# Patient Record
Sex: Female | Born: 1945 | Race: White | Hispanic: No | Smoking: Former smoker
Health system: Southern US, Community
[De-identification: ages and names within clinical notes are randomized; demographics above are authoritative.]

## PROBLEM LIST (undated history)

## (undated) DIAGNOSIS — D126 Benign neoplasm of colon, unspecified: Secondary | ICD-10-CM

## (undated) DIAGNOSIS — E039 Hypothyroidism, unspecified: Secondary | ICD-10-CM

## (undated) DIAGNOSIS — F419 Anxiety disorder, unspecified: Secondary | ICD-10-CM

## (undated) DIAGNOSIS — I491 Atrial premature depolarization: Secondary | ICD-10-CM

## (undated) HISTORY — DX: Atrial premature depolarization: I49.1

## (undated) HISTORY — DX: Hypothyroidism, unspecified: E03.9

## (undated) HISTORY — DX: Benign neoplasm of colon, unspecified: D12.6

## (undated) HISTORY — PX: CARPAL TUNNEL RELEASE: SHX101

## (undated) HISTORY — PX: BREAST BIOPSY: SHX20

## (undated) HISTORY — DX: Anxiety disorder, unspecified: F41.9

---
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
## 1972-09-15 HISTORY — PX: EXCISIONAL HEMORRHOIDECTOMY: SHX1541

## 1984-09-15 HISTORY — PX: THYROIDECTOMY: SHX17

## 1998-01-24 ENCOUNTER — Other Ambulatory Visit: Admission: RE | Admit: 1998-01-24 | Discharge: 1998-01-24 | Payer: Self-pay | Admitting: Obstetrics & Gynecology

## 1998-04-30 ENCOUNTER — Other Ambulatory Visit: Admission: RE | Admit: 1998-04-30 | Discharge: 1998-04-30 | Payer: Self-pay | Admitting: Obstetrics & Gynecology

## 1998-10-16 ENCOUNTER — Other Ambulatory Visit: Admission: RE | Admit: 1998-10-16 | Discharge: 1998-10-16 | Payer: Self-pay | Admitting: Obstetrics & Gynecology

## 1999-05-22 ENCOUNTER — Other Ambulatory Visit: Admission: RE | Admit: 1999-05-22 | Discharge: 1999-05-22 | Payer: Self-pay | Admitting: Obstetrics & Gynecology

## 2000-06-04 ENCOUNTER — Other Ambulatory Visit: Admission: RE | Admit: 2000-06-04 | Discharge: 2000-06-04 | Payer: Self-pay | Admitting: Obstetrics & Gynecology

## 2001-09-01 ENCOUNTER — Other Ambulatory Visit: Admission: RE | Admit: 2001-09-01 | Discharge: 2001-09-01 | Payer: Self-pay | Admitting: Obstetrics & Gynecology

## 2002-11-14 ENCOUNTER — Other Ambulatory Visit: Admission: RE | Admit: 2002-11-14 | Discharge: 2002-11-14 | Payer: Self-pay | Admitting: Obstetrics & Gynecology

## 2004-04-10 ENCOUNTER — Other Ambulatory Visit: Admission: RE | Admit: 2004-04-10 | Discharge: 2004-04-10 | Payer: Self-pay | Admitting: Obstetrics & Gynecology

## 2004-09-18 ENCOUNTER — Emergency Department: Payer: Self-pay | Admitting: General Practice

## 2005-03-14 ENCOUNTER — Emergency Department: Payer: Self-pay | Admitting: Emergency Medicine

## 2005-03-22 ENCOUNTER — Other Ambulatory Visit: Payer: Self-pay

## 2005-03-22 ENCOUNTER — Emergency Department: Payer: Self-pay | Admitting: Emergency Medicine

## 2005-05-29 ENCOUNTER — Other Ambulatory Visit: Admission: RE | Admit: 2005-05-29 | Discharge: 2005-05-29 | Payer: Self-pay | Admitting: Obstetrics and Gynecology

## 2006-08-27 ENCOUNTER — Ambulatory Visit: Payer: Self-pay | Admitting: Gastroenterology

## 2006-09-11 ENCOUNTER — Ambulatory Visit: Payer: Self-pay | Admitting: Gastroenterology

## 2006-09-11 ENCOUNTER — Encounter (INDEPENDENT_AMBULATORY_CARE_PROVIDER_SITE_OTHER): Payer: Self-pay | Admitting: *Deleted

## 2006-09-11 DIAGNOSIS — K648 Other hemorrhoids: Secondary | ICD-10-CM | POA: Insufficient documentation

## 2006-09-11 DIAGNOSIS — D126 Benign neoplasm of colon, unspecified: Secondary | ICD-10-CM

## 2006-12-23 ENCOUNTER — Ambulatory Visit: Payer: Self-pay | Admitting: Gastroenterology

## 2006-12-23 LAB — CONVERTED CEMR LAB
Ceruloplasmin: 50 mg/dL (ref 21–63)
Ferritin: 32.4 ng/mL (ref 10.0–291.0)
HCV Ab: NEGATIVE
Hep B S Ab: NEGATIVE
INR: 1 (ref 0.9–2.0)
Prothrombin Time: 12.1 s (ref 10.0–14.0)
Saturation Ratios: 17.7 % — ABNORMAL LOW (ref 20.0–50.0)

## 2006-12-25 ENCOUNTER — Ambulatory Visit: Payer: Self-pay | Admitting: Cardiovascular Disease

## 2006-12-25 DIAGNOSIS — D376 Neoplasm of uncertain behavior of liver, gallbladder and bile ducts: Secondary | ICD-10-CM | POA: Insufficient documentation

## 2007-09-02 ENCOUNTER — Ambulatory Visit: Payer: Self-pay | Admitting: Cardiology

## 2007-09-08 ENCOUNTER — Ambulatory Visit: Payer: Self-pay | Admitting: Gastroenterology

## 2007-09-15 DIAGNOSIS — T7840XA Allergy, unspecified, initial encounter: Secondary | ICD-10-CM | POA: Insufficient documentation

## 2007-09-15 DIAGNOSIS — K449 Diaphragmatic hernia without obstruction or gangrene: Secondary | ICD-10-CM | POA: Insufficient documentation

## 2007-09-15 DIAGNOSIS — R945 Abnormal results of liver function studies: Secondary | ICD-10-CM

## 2007-09-15 DIAGNOSIS — E7800 Pure hypercholesterolemia, unspecified: Secondary | ICD-10-CM

## 2007-09-21 ENCOUNTER — Ambulatory Visit: Payer: Self-pay | Admitting: Gastroenterology

## 2007-09-22 ENCOUNTER — Encounter (INDEPENDENT_AMBULATORY_CARE_PROVIDER_SITE_OTHER): Payer: Self-pay | Admitting: Interventional Radiology

## 2007-09-22 ENCOUNTER — Ambulatory Visit (HOSPITAL_COMMUNITY): Admission: RE | Admit: 2007-09-22 | Discharge: 2007-09-22 | Payer: Self-pay | Admitting: Gastroenterology

## 2007-10-26 ENCOUNTER — Emergency Department: Payer: Self-pay | Admitting: Emergency Medicine

## 2007-10-27 ENCOUNTER — Ambulatory Visit: Payer: Self-pay | Admitting: Gastroenterology

## 2010-01-03 ENCOUNTER — Encounter: Admission: RE | Admit: 2010-01-03 | Discharge: 2010-01-03 | Payer: Self-pay | Admitting: Endocrinology

## 2010-04-15 ENCOUNTER — Ambulatory Visit: Payer: Self-pay | Admitting: Surgery

## 2010-04-17 ENCOUNTER — Telehealth (INDEPENDENT_AMBULATORY_CARE_PROVIDER_SITE_OTHER): Payer: Self-pay | Admitting: *Deleted

## 2010-04-18 ENCOUNTER — Encounter (HOSPITAL_COMMUNITY): Admission: RE | Admit: 2010-04-18 | Discharge: 2010-06-07 | Payer: Self-pay | Admitting: Surgery

## 2010-04-18 ENCOUNTER — Ambulatory Visit: Payer: Self-pay | Admitting: Internal Medicine

## 2010-04-18 ENCOUNTER — Encounter: Payer: Self-pay | Admitting: Internal Medicine

## 2010-04-18 ENCOUNTER — Ambulatory Visit: Payer: Self-pay

## 2010-04-22 ENCOUNTER — Ambulatory Visit: Payer: Self-pay | Admitting: Surgery

## 2010-04-22 ENCOUNTER — Encounter: Admission: RE | Admit: 2010-04-22 | Discharge: 2010-04-22 | Payer: Self-pay | Admitting: Surgery

## 2010-04-25 ENCOUNTER — Encounter: Payer: Self-pay | Admitting: Surgery

## 2010-04-26 ENCOUNTER — Ambulatory Visit: Payer: Self-pay | Admitting: Surgery

## 2010-04-26 ENCOUNTER — Inpatient Hospital Stay (HOSPITAL_COMMUNITY): Admission: RE | Admit: 2010-04-26 | Discharge: 2010-04-27 | Payer: Self-pay | Admitting: Surgery

## 2010-05-13 ENCOUNTER — Ambulatory Visit: Payer: Self-pay | Admitting: Surgery

## 2010-09-15 HISTORY — PX: CAROTID ENDARTERECTOMY: SUR193

## 2010-10-06 ENCOUNTER — Encounter: Payer: Self-pay | Admitting: Gastroenterology

## 2010-10-17 NOTE — Progress Notes (Signed)
Summary: Nuclear Pre-Pocedure  Phone Note Outgoing Call   Call placed by: Milana Na, EMT-P,  April 17, 2010 8:50 AM Summary of Call: Left message with information on Myoview Information Sheet (see scanned document for details). No Answer.     Nuclear Med Background Indications for Stress Test: Evaluation for Ischemia, Surgical Clearance  Indications Comments: Pending Carotid Surgery       Nuclear Pre-Procedure Cardiac Risk Factors: Carotid Disease, Family History - CAD, Hypertension, NIDDM, Smoker  Nuclear Med Study Referring MD:  Melissa Noon

## 2010-10-17 NOTE — Assessment & Plan Note (Signed)
Summary: Cardiology Nuclear Testing  Nuclear Med Background Indications for Stress Test: Evaluation for Ischemia, Surgical Clearance  Indications Comments: Pending Carotid Surgery  History: Myocardial Infarction, Myocardial Perfusion Study  History Comments: MPS EF 79% (-) ischemia MVP     Nuclear Pre-Procedure Cardiac Risk Factors: Carotid Disease, Family History - CAD, Hypertension, NIDDM, Smoker Caffeine/Decaff Intake: None NPO After: 12:00 AM Lungs: clear IV 0.9% NS with Angio Cath: 22g     IV Site: (R) AC IV Started by: Irean Hong RN Chest Size (in) 38     Cup Size C     Height (in): 62 Weight (lb): 143 BMI: 26.25  Nuclear Med Study 1 or 2 day study:  1 day     Stress Test Type:  Eugenie Birks Reading MD:  Dietrich Pates, MD     Referring MD:  Coral Else Resting Radionuclide:  Technetium 4m Tetrofosmin     Resting Radionuclide Dose:  11.0 mCi  Stress Radionuclide:  Technetium 53m Tetrofosmin     Stress Radionuclide Dose:  30.1 mCi   Stress Protocol   Lexiscan: 0.4 mg   Stress Test Technologist:  Milana Na EMT-P     Nuclear Technologist:  Domenic Polite CNMT  Rest Procedure  Myocardial perfusion imaging was performed at rest 45 minutes following the intravenous administration of Myoview Technetium 52m Tetrofosmin.  Stress Procedure  The patient received IV Lexiscan 0.4 mg over 15-seconds.  Myoview injected at 30-seconds.  There were non specific changes and rare pacs with infusion.  Quantitative spect images were obtained after a 45 minute delay.  QPS Raw Data Images:  Extensive soft tissue (breast, diaphragm) surround heart. Stress Images:  There is normal uptake in all areas. Rest Images:  Normal homogeneous uptake in all areas of the myocardium. Subtraction (SDS):  No evidence of ischemia. Transient Ischemic Dilatation:  .81  (Normal <1.22)  Lung/Heart Ratio:  .31  (Normal <0.45)  Quantitative Gated Spect Images QGS EDV:  57 ml QGS ESV:  18 ml QGS  EF:  69 %   Overall Impression  Exercise Capacity: Lexiscan lprotocol. BP Response: Normal blood pressure response. Clinical Symptoms: No chest pain ECG Impression: No significant ST segment change suggestive of ischemia. Overall Impression: Normal stress nuclear study.

## 2010-11-04 ENCOUNTER — Ambulatory Visit: Payer: Self-pay | Admitting: Surgery

## 2010-11-04 ENCOUNTER — Other Ambulatory Visit: Payer: Self-pay

## 2010-11-29 LAB — CBC
HCT: 35.9 % — ABNORMAL LOW (ref 36.0–46.0)
HCT: 42.1 % (ref 36.0–46.0)
Hemoglobin: 11.6 g/dL — ABNORMAL LOW (ref 12.0–15.0)
Hemoglobin: 14.3 g/dL (ref 12.0–15.0)
MCH: 28.4 pg (ref 26.0–34.0)
MCH: 29.7 pg (ref 26.0–34.0)
MCHC: 34 g/dL (ref 30.0–36.0)
MCV: 87.5 fL (ref 78.0–100.0)
RBC: 4.09 MIL/uL (ref 3.87–5.11)
RBC: 4.81 MIL/uL (ref 3.87–5.11)

## 2010-11-29 LAB — TYPE AND SCREEN: Antibody Screen: NEGATIVE

## 2010-11-29 LAB — COMPREHENSIVE METABOLIC PANEL
ALT: 36 U/L — ABNORMAL HIGH (ref 0–35)
BUN: 5 mg/dL — ABNORMAL LOW (ref 6–23)
CO2: 25 mEq/L (ref 19–32)
Calcium: 9.1 mg/dL (ref 8.4–10.5)
Creatinine, Ser: 0.74 mg/dL (ref 0.4–1.2)
GFR calc non Af Amer: >60 mL/min (ref >60)
Glucose, Bld: 86 mg/dL (ref 70–99)
Sodium: 135 mEq/L (ref 135–145)
Total Protein: 7.1 g/dL (ref 6.0–8.3)

## 2010-11-29 LAB — BASIC METABOLIC PANEL
Chloride: 104 mEq/L (ref 96–112)
GFR calc non Af Amer: >60 mL/min (ref >60)
Glucose, Bld: 161 mg/dL — ABNORMAL HIGH (ref 70–99)
Potassium: 3.3 mEq/L — ABNORMAL LOW (ref 3.5–5.1)
Sodium: 138 mEq/L (ref 135–145)

## 2010-11-29 LAB — URINALYSIS, ROUTINE W REFLEX MICROSCOPIC
Glucose, UA: NEGATIVE mg/dL
Ketones, ur: NEGATIVE mg/dL
Protein, ur: NEGATIVE mg/dL
Urobilinogen, UA: 0.2 mg/dL (ref 0.0–1.0)

## 2010-11-29 LAB — APTT: aPTT: 28 seconds (ref 24–37)

## 2010-11-29 LAB — PROTIME-INR: Prothrombin Time: 13.5 seconds (ref 11.6–15.2)

## 2010-12-26 ENCOUNTER — Other Ambulatory Visit: Payer: Self-pay | Admitting: Surgery

## 2010-12-26 DIAGNOSIS — R911 Solitary pulmonary nodule: Secondary | ICD-10-CM

## 2011-01-20 ENCOUNTER — Ambulatory Visit
Admission: RE | Admit: 2011-01-20 | Discharge: 2011-01-20 | Disposition: A | Discharge status: Home / Self Care | Source: Ambulatory Visit | Attending: Surgery | Admitting: Surgery

## 2011-01-20 ENCOUNTER — Other Ambulatory Visit (INDEPENDENT_AMBULATORY_CARE_PROVIDER_SITE_OTHER)

## 2011-01-20 ENCOUNTER — Ambulatory Visit (INDEPENDENT_AMBULATORY_CARE_PROVIDER_SITE_OTHER): Admitting: Surgery

## 2011-01-20 DIAGNOSIS — I6529 Occlusion and stenosis of unspecified carotid artery: Secondary | ICD-10-CM

## 2011-01-20 DIAGNOSIS — R911 Solitary pulmonary nodule: Secondary | ICD-10-CM

## 2011-01-20 DIAGNOSIS — Z48812 Encounter for surgical aftercare following surgery on the circulatory system: Secondary | ICD-10-CM

## 2011-01-20 MED ORDER — IOHEXOL 300 MG/ML  SOLN
75.0000 mL | Freq: Once | INTRAMUSCULAR | Status: AC | PRN
  Administered 2011-01-20: 75 mL via INTRAVENOUS

## 2011-01-20 NOTE — Assessment & Plan Note (Signed)
OFFICE VISIT  Amores, [name] [name] DOB:  11/05/1945                                       01/20/2011 AVWUJ#:[id number]  REASON FOR VISIT:  Followup carotid endarterectomy, right.  HISTORY:  This is a 65 year old female who is status post right carotid endarterectomy for asymptomatic stenosis on 04/27/2011.  She had a very high lesion and did have a mild marginal mandibular neurapraxia postoperatively but this has completely resolved.  She comes back today for followup.  She has no neurologic findings.  No complaints at this time.  She did have a 2.6 mm lung nodule seen on CT when I got a CT scan of her neck preoperatively to evaluate the distal extent of her carotid lesion.  She also has a repeat CT scan today.  She has been doing very well in the interim since I last saw her.  PHYSICAL EXAM:  Vital signs:  Heart rate 85, blood pressure 124/72, O2 sats 98%.  General:  She is well-appearing, in no distress.  HEENT: Within normal limits.  Right carotid incision is well-healed.  Lungs: Clear bilaterally.  Cardiovascular:  Regular rate and rhythm. Neurological:  She is intact.  Cranial nerves II-XII intact.  DIAGNOSTIC STUDIES:  Patent right carotid endarterectomy site without evidence of stenosis.  The left internal stenosis is 1%-39%.  Chest CT scan:  Stable noncalcified 2 mm nodule in the anterior left upper lobe of doubtful significance.  ASSESSMENT AND PLAN: 1. Carotid stenosis:  The patient will be placed on our surveillance     protocol.  Her next scan will be in 1 year. 2. Lung nodule:  The 6 month followup CT scan shows this lesion to be     of doubtful significance, I do not believe it needs to be followed     anymore.    Jorge Ny, MD Electronically Signed  VWB/MEDQ  D:  01/20/2011  T:  01/20/2011  Job:  [id number]  cc:   Reather Littler, M.D.

## 2011-01-28 NOTE — Assessment & Plan Note (Signed)
[facility]                         GASTROENTEROLOGY OFFICE NOTE   NAME:[name] [name], [name] [name]                        MRN:          [id number]  DATE:09/08/2007                            DOB:          10/18/1945    Ms. Pinho returns for followup of abnormal liver function tests.  Blood  work from Dr. Remus Blake office dated August 30, 2007 shows an ALT of 81,  an AST of 58, and a normal alkaline phosphatase, bilirubin, and albumin.  A CT scan of the abdomen and pelvis from September 02, 2007 showed stable  small hepatic cysts and mild distal esophageal wall thickening  suggestive of possible esophagitis.  She denies any ongoing reflux  symptoms, dysphagia, odynophagia, or weight loss.   CURRENT MEDICATIONS:  Listed on the chart.  Updated and reviewed.   MEDICATION ALLERGIES:  1. PENICILLIN.  2. SULFA DRUGS.   PHYSICAL EXAMINATION:  GENERAL:  No acute distress.  VITAL SIGNS:  Weight 145.6 pounds, blood pressure is 152/90, pulse 68  and regular.   She was not reexamined.   ASSESSMENT AND PLAN:  1. Abnormal liver function tests.  Currently, her transaminases are      mildly elevated.  The elevations have included alkaline phosphatase      in the past.  No clear etiology.  There was a concern that the      liver test abnormalities were related to statin drugs, but they      appear to have fluctuated substantially over the past year off all      statins.  Proceed with an ultrasound-guided liver biopsy for      further evaluation.  2. Abnormal thickening of the distal esophagus on CT scan.  Rule out      esophagitis or less likely infiltrative lesions in the distal      esophagus.  Risks, benefits, and alternatives to upper endoscopy      with possible biopsy discussed with the patient, and she consents      to proceed.  This will be scheduled electively.  3. Personal history of adenomatous colon polyps.  Recall colonoscopy      recommended for December  2012.     [name]. Russella Dar, MD, [facility]  Electronically Signed    MTS/MedQ  DD: 09/08/2007  DT: 09/08/2007  Job #: [id number]   cc:   Reather Littler, M.D.

## 2011-01-28 NOTE — Assessment & Plan Note (Signed)
OFFICE VISIT   [name] [name], [name] [name]  DOB:  01/18/1946                                       05/13/2010  ZOXWR#:[id number]   The patient comes back in today.  She is status post right carotid  endarterectomy with patch angioplasty on April 26, 2010 for  asymptomatic disease.  She had a very high lesion.  She had developed a  marginal mandibular neurapraxia but has not had any complicating  sequelae from it.  Her incision is well-healed.  She has no neurologic  deficits.   The patient will come back to see me in 6 months with a repeat carotid  ultrasound.  There was also a 2.6 mm lung nodule seen on her CT of her  neck.  I will  get a CT of her chest to better evaluate this at her next  visit.     Jorge Ny, MD  Electronically Signed   VWB/MEDQ  D:  05/13/2010  T:  05/14/2010  Job:  [id number]   cc:   Reather Littler, M.D.

## 2011-01-28 NOTE — Procedures (Signed)
CAROTID DUPLEX EXAM   INDICATION:  Known carotid disease.   HISTORY:  Diabetes:  No.  Cardiac:  Palpitations/SVT.  Hypertension:  No.  Smoking:  Yes.  Previous Surgery:  No.  CV History:  Asymptomatic.  Amaurosis Fugax No, Paresthesias No, Hemiparesis No.                                       RIGHT             LEFT  Brachial systolic pressure:         124               126  Brachial Doppler waveforms:         Normal            Normal  Vertebral direction of flow:        Antegrade         Antegrade  DUPLEX VELOCITIES (cm/sec)  CCA peak systolic                   69                100  ECA peak systolic                   102               90  ICA peak systolic                   536/mid           158  ICA end diastolic                   246/mid           66  PLAQUE MORPHOLOGY:                  Mixed             Mixed  PLAQUE AMOUNT:                      Severe            Moderate  PLAQUE LOCATION:                    ICA               ICA   IMPRESSION:  1. Doppler velocities suggest a high-grade stenosis of 80% to 99% in      the right internal carotid artery.  2. Doppler velocities suggest 40% to 59% stenosis in the left internal      carotid artery.  3. Antegrade flow noted in the bilateral vertebral arteries.   ___________________________________________  V. Charlena Cross, MD   NT/MEDQ  D:  04/15/2010  T:  04/15/2010  Job:  [id number]

## 2011-01-28 NOTE — Assessment & Plan Note (Signed)
OFFICE VISIT   Gradilla, [name] [name]  DOB:  June 29, 1946                                       04/15/2010  AOZHY#:[id number]   REASON FOR VISIT:  Asymptomatic right carotid stenosis.   HISTORY:  This is a 65 year old female seen at request of Dr. Lucianne Muss for  evaluation of right carotid stenosis.  The carotid disease was picked up  on by auscultation of a bruit.  The patient does not have any symptoms  such as numbness or weakness in either extremity, slurred speech or  amaurosis fugax.   The patient has a history of hypertension which has been medically  managed.  She does take cholesterol-lowering agents.  She has a history  smoking but is down to 2 cigarettes per day.  The patient does have  occasionally supraventricular tachycardia, she says about 1-2 times per  year.  It usually is alleviated with Valsalva maneuver but has required  adenosine.  She is seen Dr. Graciela Husbands in the past for this.   REVIEW OF SYSTEMS:  GENERAL:  Negative fevers, chills, weight gain,  weight loss.  VASCULAR:  Negative.  CARDIAC:  Positive for palpitations and arrhythmia (SVT).  GI:  Negative.  NEURO:  Negative.  PULMONARY:  HEME:  Negative.  GU:  Negative.  EENT:  Negative.  MUSCULOSKELETAL:  Negative.  PSYCH:  Negative.  SKIN:  Negative.   PAST MEDICAL HISTORY:  Diet-controlled hypertension,  hypercholesterolemia, supraventricular tachycardia.   PAST SURGICAL HISTORY:  A tonsillectomy, hemorrhoid surgery, breast  biopsy x2, thyroidectomy, carpal tunnel release bilaterally.   SOCIAL HISTORY:  She is retired and widowed.  Currently smokes 2 packs a  day.  Does not drink alcohol.   FAMILY HISTORY:  Positive for heart attack in her father at age 3.   ALLERGIES:  To penicillin and sulfa.   PHYSICAL EXAMINATION:  Heart rate 72.  Blood pressure 126/83 on the  right, 148/81 the left.  O2 saturation is 99%.  General:  She is well-  appearing, in no distress.  HEENT:  Within  normal limits.  Lungs are  clear bilaterally.  No wheezes or rhonchi.  Cardiovascular:  Regular  rate and rhythm, right positive right carotid bruit, palpable pedal  pulses.  Abdomen:  Soft, nontender.  Musculoskeletal:  Without major  deformities or cyanosis.  Neuro:  Cranial II-XII are grossly intact.  She has no focal deficits.  Skin:  Without rash.   DIAGNOSTIC STUDIES:  I have ordered and independently reviewed her  carotid ultrasound, which reveals a greater than 80% right carotid  stenosis.  Velocities on the left suggest 40%-59% stenosis.  The right  carotid has a high stenosis in mid ICA.   ASSESSMENT/PLAN:  Asymptomatic right carotid stenosis.   PLAN:  I have discussed interventions of operation versus stent for this  situation.  We discussed the risk of stroke, the risk of nerve injury  and cardiopulmonary complications should we proceed with the operative  route.  I would prefer this in her; however, by ultrasound the lesion is  very high.  I think this needs to be further evaluated.  I plan on  getting a CT angiogram to identify the extent and location of her  stenosis.  I will have this done this coming Monday.  I will see her  following that and we will plan on  her operation if she is an operative  candidate on Friday, August 12.  I am sending her to get a Myoview and  cardiac clearance.  She is a former patient of Dr. Graciela Husbands.     Jorge Ny, MD  Electronically Signed   VWB/MEDQ  D:  04/15/2010  T:  04/16/2010  Job:  [id number]   cc:   Reather Littler, M.D.

## 2011-01-28 NOTE — Assessment & Plan Note (Signed)
[facility]                         GASTROENTEROLOGY OFFICE NOTE   NAME:[name] [name] RUDA                        MRN:          [id number]  DATE:10/27/2007                            DOB:          [birth year]    This is a return office visit for gastritis, GERD, duodenitis and  abnormal liver function tests.  Her liver biopsy showed benign liver  tissue.  LA classification grade B erosive esophagitis was noted on  endoscopy.  She did not take omeprazole as prescribed because she  relates that she had side effects from Prilosec in the past, including  anxiety and epigastric pain.   CURRENT MEDICATIONS:  Listed on the chart, updated and reviewed.   MEDICATION ALLERGIES:  PENICILLIN, SULFA DRUGS AND PRILOSEC.   PHYSICAL EXAMINATION:  No acute distress.  Weight 145 pounds.  Blood  pressure 128/76, pulse 92 and regular.  She is not reexamined.   ASSESSMENT/PLAN:  1. Abnormal liver function tests.  Her liver biopsy was entirely      normal.  No identifiable hepatic disorder.  I think it would be      reasonable to follow her liver function tests every six months.      This can be performed in Dr. Remus Blake office at her routine visits.      I would like to see her back if her liver function tests      substantially increase for consideration of reevaluation.  2. Gastroesophageal reflux disease with erosive esophagitis, gastritis      and duodenitis.  She is very reluctant to take an acid suppressant      long term.  I am not quite sure of the reason.  I have advised her      to begin pantoprazole 40 mg p.o. q.a.m. with standard antireflux      measures for the next two months.  I informed her that erosive      esophagitis is likely to recur and although she should be healed at      eight weeks, we generally recommend long term acid suppression      therapy.  She is only comfortable with two months of medication at      this point, and she states she will stay  on dietary measures for      reflux.  I will see back as needed.  3. Personal history of adenomatous colon polyps.  Surveillance      colonoscopy recommended in December 2012.     [name] [name]. [name] Dar, MD, [facility]  Electronically Signed    MTS/MedQ  DD: 10/27/2007  DT: 10/28/2007  Job #: [id number]   cc:   [name] [name], M.D.

## 2011-01-28 NOTE — Assessment & Plan Note (Signed)
OFFICE VISIT   Bones, [name] [name]  DOB:  12-13-1945                                       04/22/2010  ZOXWR#:[id number]   I saw the patient last week for asymptomatic right carotid stenosis.  Ultrasound identified a high stenosis.  I sent her for a CT scan to make  sure that she is a candidate for endarterectomy.  She comes back in  today for review.  CT scan does show a high stenosis.  However, I do  think it is surgically accessible.  I discussed with the patient that  the risk of nerve injury is slightly higher.  I do feel like it can be  reached surgically.  Her operation is scheduled for this Friday.  All of  her questions were answered today.  I spent approximately 30 minutes  discussing this with her and reviewing her films.     Jorge Ny, MD  Electronically Signed   VWB/MEDQ  D:  04/22/2010  T:  04/23/2010  Job:  [id number]

## 2011-01-31 NOTE — Assessment & Plan Note (Signed)
[facility]                         GASTROENTEROLOGY OFFICE NOTE   NAME:[name] [name], [name] [name]                        MRN:          [id number]  DATE:12/23/2006                            DOB:          29-Mar-1946    Ms. Delcid is referred back to see me for followup of abnormal liver  function tests. Please see my note from November 30, 2003 and the  subsequent abdominal ultrasound imaging was normal, blood work for  standard liver diseases was also unremarkable. Liver function tests  repeated that day showed a minimally elevated alkaline phosphatase at  132. She was advised to return for followup and have a 5-prime  nucleotidase but she did not return. Several sets of blood tests are  sent from Dr. Remus Blake office from May 2006 through March 2008. Her liver  tests have fluctuated with up to 2 times normal elevations of the  transaminases and occasionally mild elevations of the alkaline  phosphatase. One set of liver function tests was entirely normal in May  2007. Her most recent liver function tests from Feb 12, 2006 show an  elevated alkaline phosphatase at 169, ALT at 96 and AST at 68. It is her  impression that cholesterol lower drugs have at times raised her liver  function tests and her liver function tests improved after discontinuing  these medications. She has not been on any cholesterol lowering  medications since November 2007. She denies any alcohol usage and notes  no family history of liver disease.   CURRENT MEDICATIONS:  Listed on the chart updated and reviewed.   MEDICATION ALLERGIES:  PENICILLIN and SULFA DRUGS.   PHYSICAL EXAMINATION:  GENERAL:  No acute distress.  VITAL SIGNS:  Weight 138 pounds, blood pressure is 115/70, pulse 88 and  regular.  HEENT:  Anicteric sclera. Oropharynx clear.  CHEST:  Clear to auscultation bilaterally.  CARDIAC:  Regular rate and rhythm without murmurs appreciated.  ABDOMEN:  Soft, nontender, nondistended,  normal active bowel sounds. No  palpable organomegaly, masses or hernias. The liver spans approximately  12 cm by percussion in the right mid clavicular line.  NEUROLOGIC:  Alert and oriented x3.   ASSESSMENT/PLAN:  Abnormal liver function tests with elevations in the  transaminases below 100 and minor alkaline phosphatase elevations.  Etiology not clear. Will plan to repeat all standard blood work for  chronic liver disease. Proceed with a CT scan of the abdomen. If the  etiology is not forthcoming, we will plan to proceed with ultrasound-  guided liver biopsy.     [name]. Russella Dar, MD, [facility]  Electronically Signed    MTS/MedQ  DD: 12/23/2006  DT: 12/23/2006  Job #: [id number]   cc:   Reather Littler, M.D.

## 2011-02-03 NOTE — Procedures (Unsigned)
CAROTID DUPLEX EXAM  INDICATION:  Carotid artery disease.  HISTORY: Diabetes:  No. Cardiac:  Palpitations. Hypertension:  No. Smoking:  Yes. Previous Surgery:  Right carotid endarterectomy 04/26/2010 by Dr. Myra Gianotti. CV History:  Currently asymptomatic. Amaurosis Fugax No, Paresthesias No, Hemiparesis No                                      RIGHT             LEFT Brachial systolic pressure:         122               123 Brachial Doppler waveforms:         Normal            Normal Vertebral direction of flow:        Antegrade         Antegrade DUPLEX VELOCITIES (cm/sec) CCA peak systolic                   83                98 ECA peak systolic                   104               74 ICA peak systolic                   129               101 ICA end diastolic                   33                29 PLAQUE MORPHOLOGY:                                    Mixed PLAQUE AMOUNT:                      None              Minimal PLAQUE LOCATION:                                      Bifurcation  IMPRESSION: 1. Patent right carotid endarterectomy site with no evidence of     restenosis of the internal carotid artery. 2. Left internal carotid artery velocity suggests 1%-39% stenosis. 3. Antegrade vertebral arteries bilaterally.  ___________________________________________ V. Charlena Cross, MD  EM/MEDQ  D:  01/20/2011  T:  01/20/2011  Job:  [id number]

## 2011-06-05 LAB — CBC
HCT: 38.6
MCV: 85
RBC: 4.54
WBC: 5.5

## 2011-09-23 ENCOUNTER — Other Ambulatory Visit: Payer: Self-pay | Admitting: Endocrinology

## 2011-09-23 DIAGNOSIS — Z1231 Encounter for screening mammogram for malignant neoplasm of breast: Secondary | ICD-10-CM

## 2011-10-06 ENCOUNTER — Encounter: Payer: Self-pay | Admitting: Gastroenterology

## 2012-01-05 ENCOUNTER — Ambulatory Visit

## 2012-01-05 ENCOUNTER — Other Ambulatory Visit

## 2012-01-20 ENCOUNTER — Other Ambulatory Visit

## 2012-05-18 ENCOUNTER — Ambulatory Visit
Admission: RE | Admit: 2012-05-18 | Discharge: 2012-05-18 | Disposition: A | Discharge status: Home / Self Care | Source: Ambulatory Visit | Attending: Endocrinology | Admitting: Endocrinology

## 2012-05-18 DIAGNOSIS — Z1231 Encounter for screening mammogram for malignant neoplasm of breast: Secondary | ICD-10-CM

## 2012-05-31 ENCOUNTER — Other Ambulatory Visit: Payer: Self-pay | Admitting: *Deleted

## 2012-05-31 DIAGNOSIS — I6529 Occlusion and stenosis of unspecified carotid artery: Secondary | ICD-10-CM

## 2012-05-31 DIAGNOSIS — Z48812 Encounter for surgical aftercare following surgery on the circulatory system: Secondary | ICD-10-CM

## 2012-06-07 ENCOUNTER — Other Ambulatory Visit (INDEPENDENT_AMBULATORY_CARE_PROVIDER_SITE_OTHER): Payer: Medicare Other | Admitting: *Deleted

## 2012-06-07 DIAGNOSIS — Z48812 Encounter for surgical aftercare following surgery on the circulatory system: Secondary | ICD-10-CM

## 2012-06-07 DIAGNOSIS — I6529 Occlusion and stenosis of unspecified carotid artery: Secondary | ICD-10-CM

## 2012-06-15 ENCOUNTER — Encounter: Payer: Self-pay | Admitting: Gastroenterology

## 2012-08-20 IMAGING — CT CT CHEST W/ CM
3 of 4 series · 16 of 30 positions shown, 17 images · IV contrast (75CC OMNI 300)
Comparison: Chest x-ray of 04/22/2010 and CTof the neck of
04/22/2010

CLINICAL DATA: History of lung nodules, follow-up, former smoking
history

CT CHEST WITH CONTRAST
TECHNIQUE: Multidetector CT imaging of the chest was performed
following the standard protocol during bolus administration of
intravenous contrast.
Contrast: 75 ml [id number]

[Series 3: routine chest · axial · 0.70mm/px · z∈[-212,-32]mm · 4 of 60 slices shown, 5 images]
[im 12/60  mediastinal]
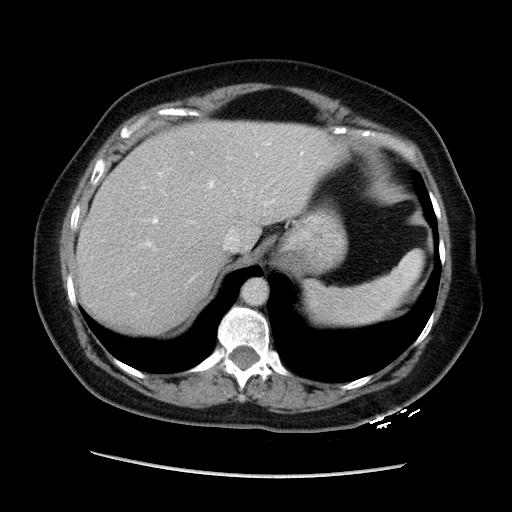
[im 12/60  lung]
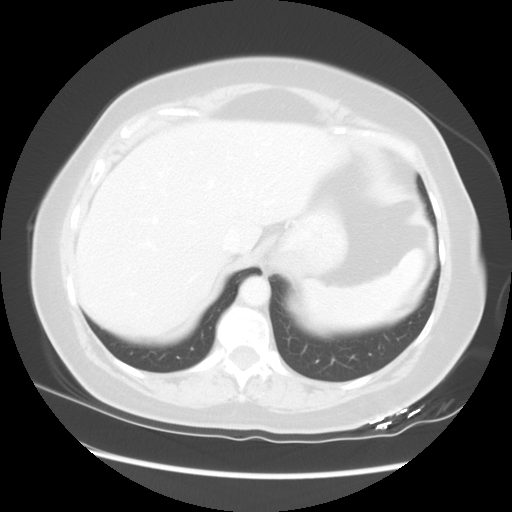
[im 24/60  lung]
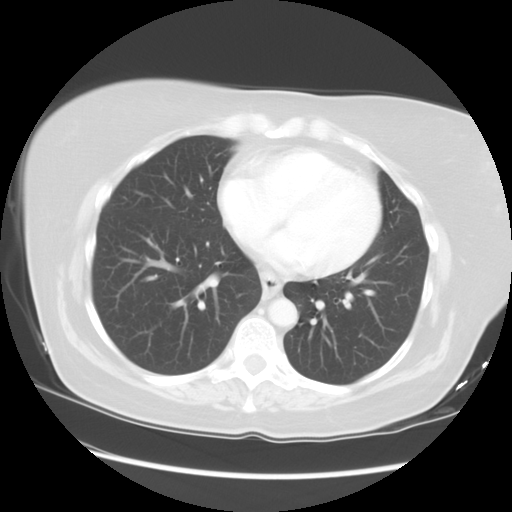
[im 36/60  lung]
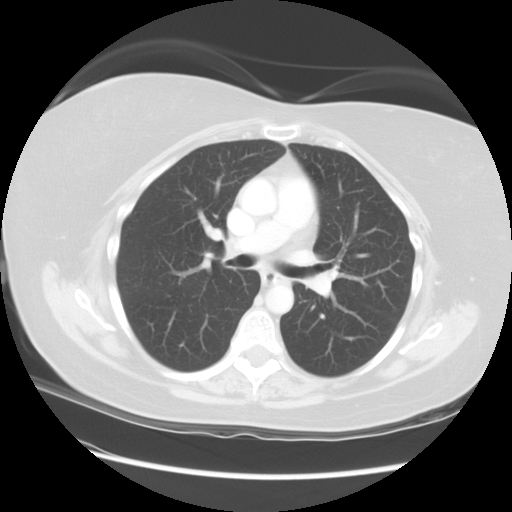
[im 48/60  lung]
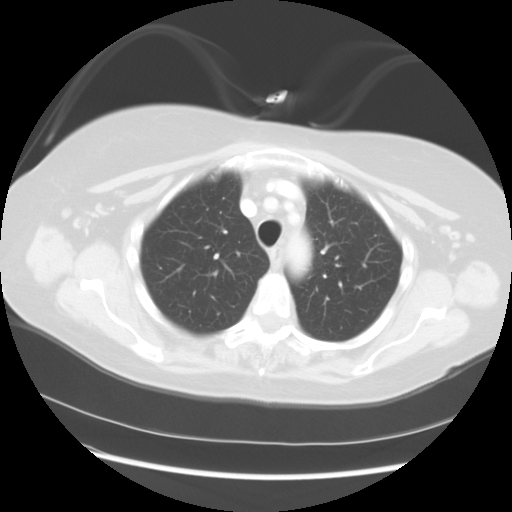

[Series 4: lung windows · axial · 0.70mm/px · z∈[-192,-28]mm · 4 of 56 slices shown]
[im 12/56  lung]
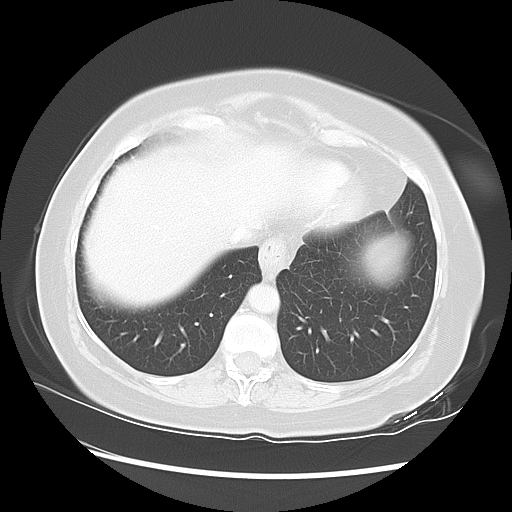
[im 23/56  lung]
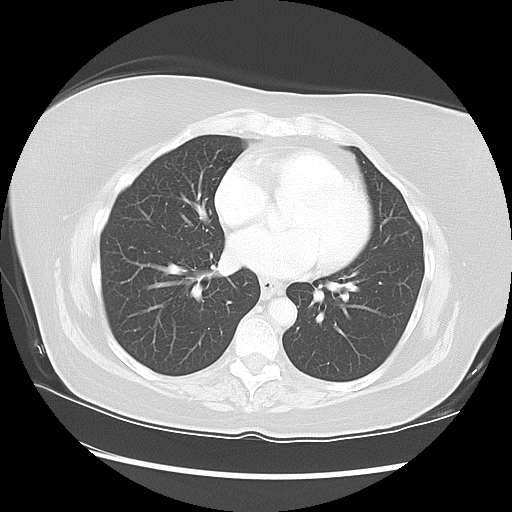
[im 34/56  lung]
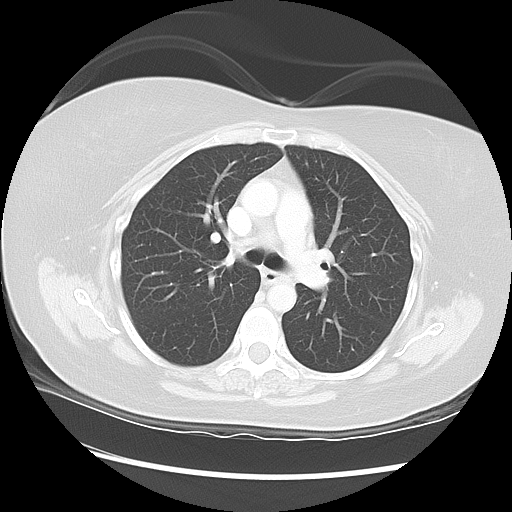
[im 45/56  lung]
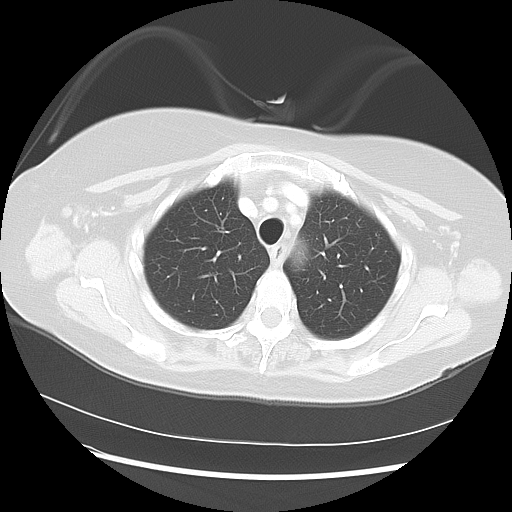

[Series 602: sagittal body · sagittal · 0.70mm/px · 8 of 145 slices shown]
[im 10/145  mediastinal]
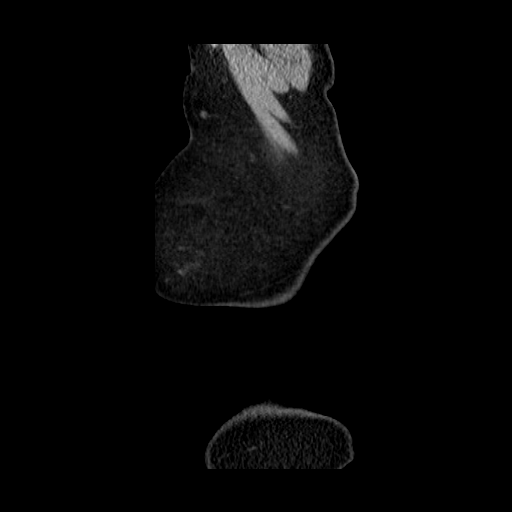
[im 29/145  mediastinal]
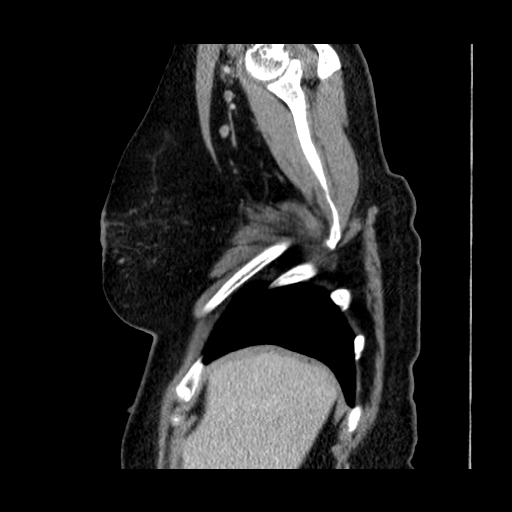
[im 49/145  mediastinal]
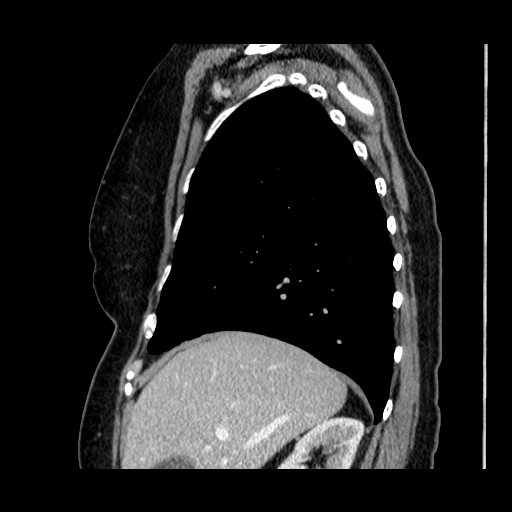
[im 68/145  mediastinal]
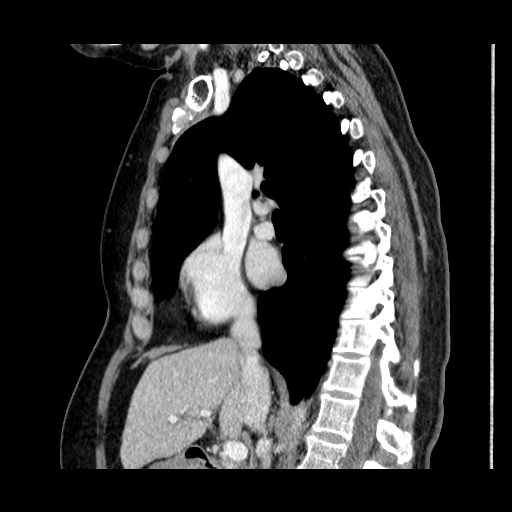
[im 77/145  mediastinal]
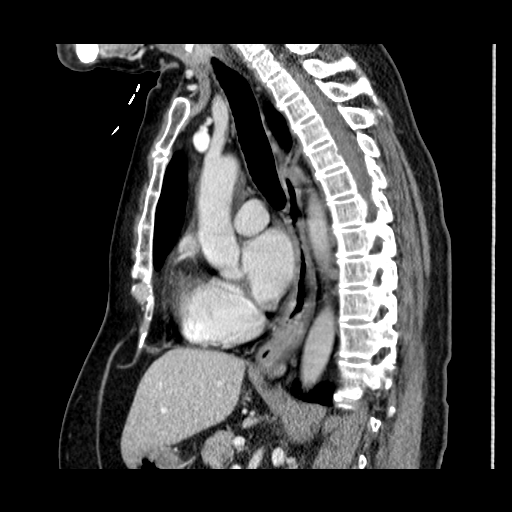
[im 97/145  mediastinal]
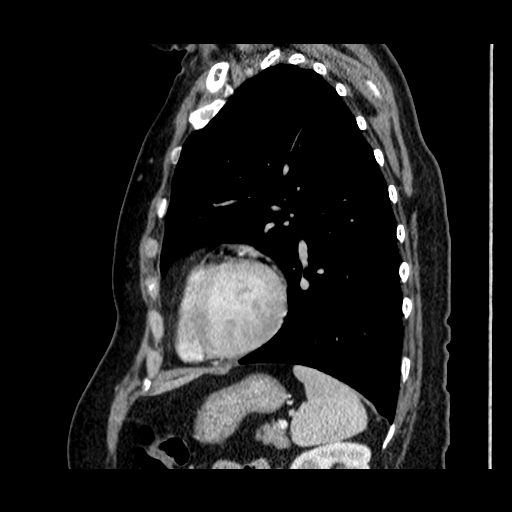
[im 116/145  mediastinal]
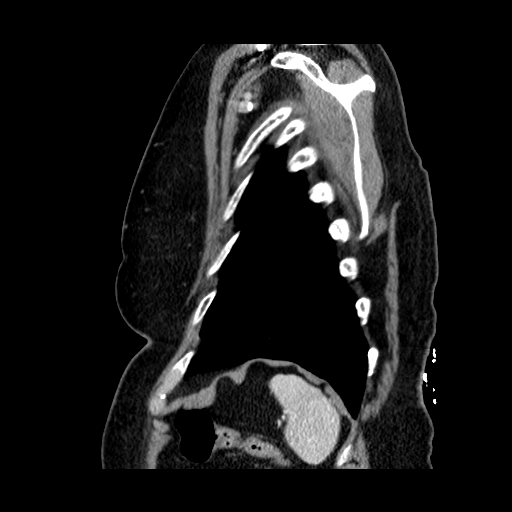
[im 135/145  mediastinal]
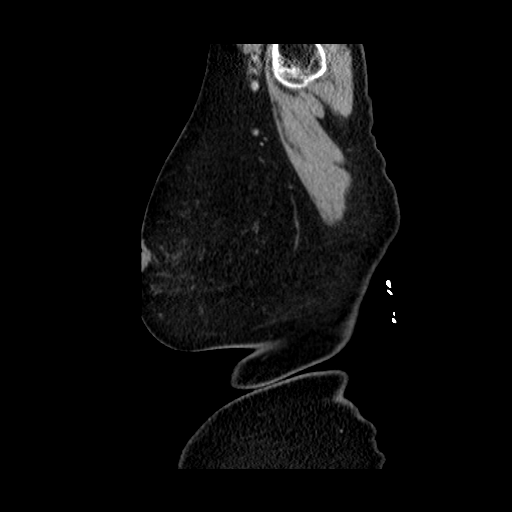

[16 of 30 positions shown; findings below may reference images not displayed]

FINDINGS: The tiny noncalcified 2-mm nodule in the anterior left
upper lobe noted on CT of the neck is completely stable and most
likely benign.  No other pulmonary nodule is seen.  No parenchymal
opacity is noted.  There is no evidence of pleural effusion.  The
left lobe of thyroid is unremarkable.  The right lobe of thyroid
appears to have been resected.

No mediastinal or hilar adenopathy is seen on soft tissue window
images.  The pulmonary arteries and thoracic aorta opacify with no
significant abnormality noted.  The heart is within normal limits
in size.  There may be a small hiatal hernia present.  What is seen
of the upper abdomen is unremarkable.  No bony abnormality is
noted.
IMPRESSION: 1.  Stable noncalcified 2-mm nodule in the anterior left upper lobe
of doubtful significance.
2.  No mediastinal or hilar adenopathy.
3.  Probable small hiatal hernia.

## 2012-12-24 ENCOUNTER — Encounter: Payer: Self-pay | Admitting: Gastroenterology

## 2013-02-25 ENCOUNTER — Encounter: Payer: Self-pay | Admitting: Gastroenterology

## 2013-03-08 ENCOUNTER — Encounter: Payer: Medicare Other | Admitting: Gastroenterology

## 2013-03-25 ENCOUNTER — Other Ambulatory Visit: Payer: Self-pay | Admitting: *Deleted

## 2013-03-25 MED ORDER — PRAVASTATIN SODIUM 80 MG PO TABS: 80.0000 mg | ORAL_TABLET | Freq: Every day | ORAL | Status: DC

## 2013-03-25 MED ORDER — PAROXETINE HCL 10 MG PO TABS: 20.0000 mg | ORAL_TABLET | ORAL | Status: DC

## 2013-03-31 ENCOUNTER — Telehealth: Payer: Self-pay | Admitting: Endocrinology

## 2013-03-31 ENCOUNTER — Other Ambulatory Visit: Payer: Self-pay | Admitting: *Deleted

## 2013-03-31 MED ORDER — PRAVASTATIN SODIUM 80 MG PO TABS: 80.0000 mg | ORAL_TABLET | Freq: Every day | ORAL | Status: DC

## 2013-03-31 MED ORDER — PAROXETINE HCL 10 MG PO TABS: 20.0000 mg | ORAL_TABLET | ORAL | Status: DC

## 2013-04-01 ENCOUNTER — Other Ambulatory Visit: Payer: Self-pay | Admitting: *Deleted

## 2013-04-01 NOTE — Telephone Encounter (Signed)
rx sent

## 2013-04-19 ENCOUNTER — Other Ambulatory Visit: Payer: Self-pay | Admitting: *Deleted

## 2013-04-19 MED ORDER — LORAZEPAM 1 MG PO TABS: 1.0000 mg | ORAL_TABLET | Freq: Two times a day (BID) | ORAL | Status: DC | PRN

## 2013-04-20 ENCOUNTER — Other Ambulatory Visit: Payer: Self-pay | Admitting: *Deleted

## 2013-05-25 ENCOUNTER — Other Ambulatory Visit: Payer: Self-pay | Admitting: *Deleted

## 2013-05-25 MED ORDER — PAROXETINE HCL 10 MG PO TABS: 20.0000 mg | ORAL_TABLET | ORAL | Status: DC

## 2013-05-25 NOTE — Telephone Encounter (Signed)
Rx was sent for the wrong amount.

## 2013-06-15 ENCOUNTER — Encounter: Payer: Self-pay | Admitting: Gastroenterology

## 2013-06-16 ENCOUNTER — Other Ambulatory Visit: Payer: Self-pay | Admitting: *Deleted

## 2013-06-16 MED ORDER — LORAZEPAM 1 MG PO TABS: 1.0000 mg | ORAL_TABLET | Freq: Two times a day (BID) | ORAL | Status: DC | PRN

## 2013-07-21 ENCOUNTER — Other Ambulatory Visit: Payer: Self-pay | Admitting: *Deleted

## 2013-07-21 MED ORDER — LEVOTHYROXINE SODIUM 125 MCG PO TABS: 125.0000 ug | ORAL_TABLET | Freq: Every day | ORAL | Status: DC

## 2013-07-25 ENCOUNTER — Other Ambulatory Visit: Payer: Self-pay | Admitting: *Deleted

## 2013-07-25 MED ORDER — LEVOTHYROXINE SODIUM 125 MCG PO TABS: ORAL_TABLET | ORAL | Status: DC

## 2013-08-24 ENCOUNTER — Ambulatory Visit (AMBULATORY_SURGERY_CENTER): Payer: Medicare Other

## 2013-08-24 VITALS — Ht 62.0 in | Wt 148.0 lb

## 2013-08-24 DIAGNOSIS — Z860100 Personal history of colon polyps, unspecified: Secondary | ICD-10-CM

## 2013-08-24 MED ORDER — MOVIPREP 100 G PO SOLR: 1.0000 | Freq: Once | ORAL | Status: DC

## 2013-08-30 ENCOUNTER — Encounter: Payer: Self-pay | Admitting: Gastroenterology

## 2013-08-30 ENCOUNTER — Ambulatory Visit (AMBULATORY_SURGERY_CENTER): Payer: Medicare Other | Admitting: Gastroenterology

## 2013-08-30 VITALS — BP 136/84 | HR 76 | Temp 96.8°F | Resp 19 | Ht 62.0 in | Wt 148.0 lb

## 2013-08-30 DIAGNOSIS — D126 Benign neoplasm of colon, unspecified: Secondary | ICD-10-CM

## 2013-08-30 DIAGNOSIS — Z86010 Personal history of colonic polyps: Secondary | ICD-10-CM

## 2013-08-30 MED ORDER — SODIUM CHLORIDE 0.9 % IV SOLN: 500.0000 mL | INTRAVENOUS | Status: DC

## 2013-08-30 NOTE — Progress Notes (Signed)
Called to room to assist during endoscopic procedure.  Patient ID and intended procedure confirmed with present staff. Received instructions for my participation in the procedure from the performing physician.  

## 2013-08-30 NOTE — Patient Instructions (Signed)
YOU HAD AN ENDOSCOPIC PROCEDURE TODAY AT THE [facility]: Refer to the procedure report that was given to you for any specific questions about what was found during the examination.  If the procedure report does not answer your questions, please call your gastroenterologist to clarify.  If you requested that your care partner not be given the details of your procedure findings, then the procedure report has been included in a sealed envelope for you to review at your convenience later.  YOU SHOULD EXPECT: Some feelings of bloating in the abdomen. Passage of more gas than usual.  Walking can help get rid of the air that was put into your GI tract during the procedure and reduce the bloating. If you had a lower endoscopy (such as a colonoscopy or flexible sigmoidoscopy) you may notice spotting of blood in your stool or on the toilet paper. If you underwent a bowel prep for your procedure, then you may not have a normal bowel movement for a few days.  DIET: Your first meal following the procedure should be a light meal and then it is ok to progress to your normal diet.  A half-sandwich or bowl of soup is an example of a good first meal.  Heavy or fried foods are harder to digest and may make you feel nauseous or bloated.  Likewise meals heavy in dairy and vegetables can cause extra gas to form and this can also increase the bloating.  Drink plenty of fluids but you should avoid alcoholic beverages for 24 hours.  ACTIVITY: Your care partner should take you home directly after the procedure.  You should plan to take it easy, moving slowly for the rest of the day.  You can resume normal activity the day after the procedure however you should NOT DRIVE or use heavy machinery for 24 hours (because of the sedation medicines used during the test).    SYMPTOMS TO REPORT IMMEDIATELY: A gastroenterologist can be reached at any hour.  During normal business hours, 8:30 AM to 5:00 PM Monday through Friday,  call [phone].  After hours and on weekends, please call the GI answering service at [phone] who will take a message and have the physician on call contact you.   Following lower endoscopy (colonoscopy or flexible sigmoidoscopy):  Excessive amounts of blood in the stool  Significant tenderness or worsening of abdominal pains  Swelling of the abdomen that is new, acute  Fever of 100F or higher  FOLLOW UP: If any biopsies were taken you will be contacted by phone or by letter within the next 1-3 weeks.  Call your gastroenterologist if you have not heard about the biopsies in 3 weeks.  Our staff will call the home number listed on your records the next business day following your procedure to check on you and address any questions or concerns that you may have at that time regarding the information given to you following your procedure. This is a courtesy call and so if there is no answer at the home number and we have not heard from you through the emergency physician on call, we will assume that you have returned to your regular daily activities without incident.  SIGNATURES/CONFIDENTIALITY: You and/or your care partner have signed paperwork which will be entered into your electronic medical record.  These signatures attest to the fact that that the information above on your After Visit Summary has been reviewed and is understood.  Full responsibility of the confidentiality of this   discharge information lies with you and/or your care-partner.  Recommendations See procedure report  

## 2013-08-30 NOTE — Op Note (Signed)
[facility] [address] [place], [address]   COLONOSCOPY PROCEDURE REPORT  PATIENT: [name] [name], [name] [name]  MR#: [id number] BIRTHDATE: Jun 26, 1946 , 67  yrs. old GENDER: Female ENDOSCOPIST: Meryl Dare, MD, [facility] PROCEDURE DATE:  08/30/2013 PROCEDURE:   Colonoscopy with snare polypectomy First Screening Colonoscopy - Avg.  risk and is 50 yrs.  old or older - No.  Prior Negative Screening - Now for repeat screening. N/A  History of Adenoma - Now for follow-up colonoscopy & has been > or = to 3 yrs.  Yes hx of adenoma.  Has been 3 or more years since last colonoscopy.  Polyps Removed Today? Yes. ASA CLASS:   Class II INDICATIONS:Patient's personal history of adenomatous colon polyps.  MEDICATIONS: MAC sedation, administered by CRNA and propofol (Diprivan) 200mg  IV DESCRIPTION OF PROCEDURE:   After the risks benefits and alternatives of the procedure were thoroughly explained, informed consent was obtained.  A digital rectal exam revealed no abnormalities of the rectum.   The LB [id number]  endoscope was introduced through the anus and advanced to the cecum, which was identified by both the appendix and ileocecal valve. No adverse events experienced.   The quality of the prep was excellent, using MoviPrep  The instrument was then slowly withdrawn as the colon was fully examined.  COLON FINDINGS: A sessile polyp measuring 5 mm in size was found in the sigmoid colon.  A polypectomy was performed with a cold snare. The resection was complete and the polyp tissue was completely retrieved.   The colon was otherwise normal.  There was no diverticulosis, inflammation, polyps or cancers unless previously stated.  Retroflexed views revealed no abnormalities. The time to cecum=2 minutes 32 seconds.  Withdrawal time=11 minutes 24 seconds. The scope was withdrawn and the procedure completed. COMPLICATIONS: There were no complications.  ENDOSCOPIC IMPRESSION: 1.    Sessile polyp measuring 5 mm in the sigmoid colon; polypectomy performed with a cold snare 2.   The colon was otherwise normal  RECOMMENDATIONS: 1.  Await pathology results 2.  Repeat Colonoscopy in 5 years.  eSigned:  Meryl Dare, MD, [facility] 08/30/2013 8:36 AM

## 2013-08-30 NOTE — Progress Notes (Signed)
Patient did not have preoperative order for IV antibiotic SSI prophylaxis. (G8918)  Patient did not experience any of the following events: a burn prior to discharge; a fall within the facility; wrong site/side/patient/procedure/implant event; or a hospital transfer or hospital admission upon discharge from the facility. (G8907)  

## 2013-08-31 ENCOUNTER — Telehealth: Payer: Self-pay | Admitting: *Deleted

## 2013-08-31 NOTE — Telephone Encounter (Signed)
Message left

## 2013-09-06 ENCOUNTER — Encounter: Payer: Self-pay | Admitting: Gastroenterology

## 2013-09-13 ENCOUNTER — Telehealth: Payer: Self-pay | Admitting: *Deleted

## 2013-09-13 ENCOUNTER — Other Ambulatory Visit: Payer: Self-pay | Admitting: *Deleted

## 2013-09-13 MED ORDER — AZITHROMYCIN 250 MG PO TABS: ORAL_TABLET | ORAL | Status: DC

## 2013-09-13 NOTE — Telephone Encounter (Signed)
What symptoms she had specifically sinus pain, colored discharge, fever?

## 2013-09-13 NOTE — Telephone Encounter (Signed)
ZPack

## 2013-09-13 NOTE — Telephone Encounter (Signed)
Pt called saying she has been sick with a sinus infection, for almost a week, she said normally you would call in a z-pack for her, she would like for you to call in another rx for her.  Please advise  CB # [id number]

## 2013-09-13 NOTE — Telephone Encounter (Signed)
rx sent, pt is aware 

## 2013-09-13 NOTE — Telephone Encounter (Signed)
She said she is having bad sinus pain, congested and coughing. No fever, blowing colored mucus out of her nose.

## 2013-10-11 ENCOUNTER — Other Ambulatory Visit: Payer: Self-pay | Admitting: *Deleted

## 2013-10-11 MED ORDER — LORAZEPAM 1 MG PO TABS: 1.0000 mg | ORAL_TABLET | Freq: Two times a day (BID) | ORAL | Status: DC | PRN

## 2014-01-12 ENCOUNTER — Other Ambulatory Visit: Payer: Self-pay | Admitting: *Deleted

## 2014-01-12 MED ORDER — PAROXETINE HCL 10 MG PO TABS: 20.0000 mg | ORAL_TABLET | ORAL | Status: DC

## 2014-01-31 ENCOUNTER — Other Ambulatory Visit: Payer: Self-pay | Admitting: *Deleted

## 2014-01-31 MED ORDER — LEVOTHYROXINE SODIUM 125 MCG PO TABS: ORAL_TABLET | ORAL | Status: DC

## 2014-01-31 MED ORDER — LORAZEPAM 1 MG PO TABS: 1.0000 mg | ORAL_TABLET | Freq: Two times a day (BID) | ORAL | Status: DC | PRN

## 2014-01-31 MED ORDER — PAROXETINE HCL 10 MG PO TABS: 20.0000 mg | ORAL_TABLET | ORAL | Status: DC

## 2014-02-03 ENCOUNTER — Other Ambulatory Visit: Payer: Self-pay | Admitting: *Deleted

## 2014-02-03 MED ORDER — PAROXETINE HCL 10 MG PO TABS: 20.0000 mg | ORAL_TABLET | ORAL | Status: DC

## 2014-02-03 MED ORDER — LORAZEPAM 1 MG PO TABS: 1.0000 mg | ORAL_TABLET | Freq: Two times a day (BID) | ORAL | Status: DC | PRN

## 2014-02-17 ENCOUNTER — Other Ambulatory Visit: Payer: Medicare Other

## 2014-02-22 ENCOUNTER — Encounter: Payer: Medicare Other | Admitting: Endocrinology

## 2014-03-06 ENCOUNTER — Other Ambulatory Visit: Payer: Self-pay | Admitting: *Deleted

## 2014-03-06 ENCOUNTER — Encounter: Payer: Self-pay | Admitting: *Deleted

## 2014-03-06 ENCOUNTER — Other Ambulatory Visit (INDEPENDENT_AMBULATORY_CARE_PROVIDER_SITE_OTHER): Payer: Medicare Other

## 2014-03-06 DIAGNOSIS — E7800 Pure hypercholesterolemia, unspecified: Secondary | ICD-10-CM

## 2014-03-06 DIAGNOSIS — E039 Hypothyroidism, unspecified: Secondary | ICD-10-CM

## 2014-03-06 LAB — LIPID PANEL
Cholesterol: 212 mg/dL — ABNORMAL HIGH (ref 0–200)
HDL: 39.5 mg/dL
LDL Cholesterol: 138 mg/dL — ABNORMAL HIGH (ref 0–99)
NonHDL: 172.5
Total CHOL/HDL Ratio: 5
Triglycerides: 172 mg/dL — ABNORMAL HIGH (ref 0.0–149.0)
VLDL: 34.4 mg/dL (ref 0.0–40.0)

## 2014-03-06 LAB — TSH: TSH: 1.21 u[IU]/mL (ref 0.35–4.50)

## 2014-03-06 LAB — T4, FREE: Free T4: 1.02 ng/dL (ref 0.60–1.60)

## 2014-03-06 MED ORDER — PRAVASTATIN SODIUM 80 MG PO TABS: 80.0000 mg | ORAL_TABLET | Freq: Every day | ORAL | Status: DC

## 2014-03-09 ENCOUNTER — Other Ambulatory Visit: Payer: Self-pay | Admitting: *Deleted

## 2014-03-09 ENCOUNTER — Encounter: Payer: Self-pay | Admitting: Endocrinology

## 2014-03-09 ENCOUNTER — Ambulatory Visit (INDEPENDENT_AMBULATORY_CARE_PROVIDER_SITE_OTHER): Payer: Medicare Other | Admitting: Endocrinology

## 2014-03-09 VITALS — BP 132/82 | HR 96 | Temp 98.2°F | Resp 14 | Ht 61.25 in | Wt 153.0 lb

## 2014-03-09 DIAGNOSIS — Z0000 Encounter for general adult medical examination without abnormal findings: Secondary | ICD-10-CM

## 2014-03-09 DIAGNOSIS — E039 Hypothyroidism, unspecified: Secondary | ICD-10-CM

## 2014-03-09 DIAGNOSIS — M8580 Other specified disorders of bone density and structure, unspecified site: Secondary | ICD-10-CM

## 2014-03-09 DIAGNOSIS — M949 Disorder of cartilage, unspecified: Secondary | ICD-10-CM

## 2014-03-09 DIAGNOSIS — E7800 Pure hypercholesterolemia, unspecified: Secondary | ICD-10-CM

## 2014-03-09 DIAGNOSIS — M899 Disorder of bone, unspecified: Secondary | ICD-10-CM

## 2014-03-09 MED ORDER — LEVOTHYROXINE SODIUM 125 MCG PO TABS: ORAL_TABLET | ORAL | Status: DC

## 2014-03-09 MED ORDER — PAROXETINE HCL 10 MG PO TABS: 20.0000 mg | ORAL_TABLET | ORAL | Status: DC

## 2014-03-09 MED ORDER — PANTOPRAZOLE SODIUM 20 MG PO TBEC: 20.0000 mg | DELAYED_RELEASE_TABLET | Freq: Every day | ORAL | Status: DC

## 2014-03-09 MED ORDER — PRAVASTATIN SODIUM 80 MG PO TABS: 80.0000 mg | ORAL_TABLET | Freq: Every day | ORAL | Status: DC

## 2014-03-09 MED ORDER — LORAZEPAM 1 MG PO TABS: 1.0000 mg | ORAL_TABLET | Freq: Two times a day (BID) | ORAL | Status: DC | PRN

## 2014-03-09 NOTE — Progress Notes (Signed)
Patient ID: [name] [name], female   DOB: 1946/01/31, 68 y.o.   MRN: [id number]  Subjective:    Patient is being seen today for Medicare initial wellness visit and review of chronic problems.    Risk factors:   Hypercholesterolemia  Roster of Physicians Providing Medical Care to Patient:  See "care team"  Activities of Daily Living:  In the present state of health, the patient has no difficulty performing the following activities:  Preparing food and eating , Bathing, Getting dressed,  Using the toilet:  Moving around from place to place:   In the past year the patient has not fallen or had a near fall    Safety: Has smoke detectors including carbon monoxide detectors and wears seat belts.   No excess sun exposure.  Diet and Exercise  Current exercise habits: walks about 3 miles a week, less now because of the hot weather Dietary issues discussed: heart healthy diet, mostly follows good diet, drinks 2 regular cokes daily Has been gaining weight and has gained about 14 pounds in the last 3 years  Depression Screen:  Q1: Over the past two weeks, have you felt down, depressed or hopeless? no  Q2: Over the past two weeks, have you felt little interest or pleasure in doing things? no   The following portions of the patient's history were reviewed and updated as appropriate: allergies, current medications, past family history, past medical history, past social history, past surgical history and problem list.   Patient Active Problem List   Diagnosis Date Noted  . HYPERCHOLESTEROLEMIA 09/15/2007  . HIATAL HERNIA 09/15/2007  . LIVER FUNCTION TESTS, ABNORMAL 09/15/2007  . ALLERGY 09/15/2007  . LIVER MASS 12/25/2006  . COLONIC POLYPS, ADENOMATOUS 09/11/2006  . HEMORRHOIDS, INTERNAL 09/11/2006     Review of Systems  Denies hearing loss, and visual changes, uses reading glasses only  Hypercholesterolemia: This appears to be somewhat worse, LDL has been near 100 previously Currently on  a statin drug because of history of hypercholesterolemia and carotid occlusive disease No symptoms of unilateral weakness or speech difficulty  No fatigue, no tingling in the feet Had long-standing history of mild hypothyroidism, compliant with her supplement and tolerating it well  Prior history of impaired fasting glucose  LABS:  Appointment on 03/06/2014  Component Date Value Ref Range Status  . TSH 03/06/2014 1.21  0.35 - 4.50 uIU/mL Final  . Free T4 03/06/2014 1.02  0.60 - 1.60 ng/dL Final  . Cholesterol 03/06/2014 212* 0 - 200 mg/dL Final   ATP III Classification       Desirable:  < 200 mg/dL               Borderline High:  200 - 239 mg/dL          High:  > = 240 mg/dL  . Triglycerides 03/06/2014 172.0* 0.0 - 149.0 mg/dL Final   Normal:  <150 mg/dLBorderline High:  150 - 199 mg/dL  . HDL 03/06/2014 39.50  >39.00 mg/dL Final  . VLDL 03/06/2014 34.4  0.0 - 40.0 mg/dL Final  . LDL Cholesterol 03/06/2014 138* 0 - 99 mg/dL Final  . Total CHOL/HDL Ratio 03/06/2014 5   Final                  Men          Women1/2 Average Risk     3.4          3.3Average Risk  5.0          4.42X Average Risk          9.6          7.13X Average Risk          15.0          11.0                      . NonHDL 03/06/2014 172.50   Final    Objective:   Vision:  Normal grossly  Hearing: grossly normal BP 132/82  Pulse 96  Temp(Src) 98.2 F (36.8 C)  Resp 14  Ht 5' 1.25" (1.556 m)  Wt 153 lb (69.4 kg)  BMI 28.66 kg/m2  SpO2 96%  No carotid bruit heard, right carotid endarterectomy scar Heart sounds normal, rhythm regular Neurological:  she is able to perform  "get-up-and-go" from a sitting position Cognitive Impairment Assessment: cognition, memory and judgment appear normal.  remembers 3/3 at 5 minutes. Has excellent recall.  Normal reading ability. She is  alert and oriented x 3 No ankle edema   Assessment:   Medicare wellness evaluation done as above   Preventive parameters reviewed  and actions discussed  Hypercholesterolemia: Needs better meal planning, weight loss and exercise will consider alternative drugs for adding Zetia Hypothyroidism: Adequately controlled with 62.5 mcg Synthroid, and needs at least annual followup    Plan:   During the course of the visit the patient was educated and counseled about appropriate screening and preventive services including:       Fall prevention   Screening mammography  which is due Bone densitometry screeningrecommended,  last 9/13,previous T score  -2.5  Diabetes screening is due Nutrition counseling done, Needs to improve her diet Exercise regimen: She needs to start walking more regularly, goal of 10 miles per week. She can try to do this at the mall  Lipid screening, done Colorectal screening: Complete, last colonoscopy 12/14  Regular eye exams recommended Low-dose aspirin not indicated Followup in 3 months  Vaccines / LABS Zostavax up-to-date  Pneumococcal Vaccine/Prevnar need to be done, history of previous vaccination needs to be confirmed   Patient Instructions (the written plan) was given to the patient.  [facility] 03/09/2014

## 2014-03-09 NOTE — Patient Instructions (Addendum)
Walk at mail, 10 miles a week [employer]  Screening tests when back from [place] saturated fat diet

## 2014-03-10 ENCOUNTER — Other Ambulatory Visit: Payer: Self-pay | Admitting: *Deleted

## 2014-03-10 NOTE — Telephone Encounter (Signed)
Pt needs the ativan--pharmacy received all the other meds except for the ativan

## 2014-03-13 NOTE — Telephone Encounter (Signed)
?   Already given

## 2014-04-14 ENCOUNTER — Encounter: Payer: Self-pay | Admitting: Endocrinology

## 2014-05-26 ENCOUNTER — Other Ambulatory Visit: Payer: Medicare Other

## 2014-05-29 ENCOUNTER — Ambulatory Visit: Payer: Medicare Other | Admitting: Endocrinology

## 2014-06-19 ENCOUNTER — Other Ambulatory Visit (INDEPENDENT_AMBULATORY_CARE_PROVIDER_SITE_OTHER): Payer: Medicare Other

## 2014-06-19 DIAGNOSIS — E7800 Pure hypercholesterolemia, unspecified: Secondary | ICD-10-CM

## 2014-06-19 DIAGNOSIS — M8580 Other specified disorders of bone density and structure, unspecified site: Secondary | ICD-10-CM

## 2014-06-19 LAB — URINALYSIS, ROUTINE W REFLEX MICROSCOPIC
Bilirubin Urine: NEGATIVE
Ketones, ur: NEGATIVE
Leukocytes, UA: NEGATIVE
Nitrite: NEGATIVE
PH: 6 (ref 5.0–8.0)
Specific Gravity, Urine: 1.025 (ref 1.000–1.030)
Total Protein, Urine: NEGATIVE
UROBILINOGEN UA: 0.2 (ref 0.0–1.0)
Urine Glucose: NEGATIVE

## 2014-06-19 LAB — COMPREHENSIVE METABOLIC PANEL
ALK PHOS: 114 U/L (ref 39–117)
ALT: 26 U/L (ref 0–35)
AST: 42 U/L — AB (ref 0–37)
Albumin: 3.9 g/dL (ref 3.5–5.2)
BUN: 10 mg/dL (ref 6–23)
CALCIUM: 8.9 mg/dL (ref 8.4–10.5)
CO2: 21 mEq/L (ref 19–32)
CREATININE: 0.7 mg/dL (ref 0.4–1.2)
Chloride: 105 mEq/L (ref 96–112)
GFR: 91.71 mL/min (ref >60.00)
GLUCOSE: 97 mg/dL (ref 70–99)
Potassium: 4.1 mEq/L (ref 3.5–5.1)
Sodium: 135 mEq/L (ref 135–145)
Total Bilirubin: 0.3 mg/dL (ref 0.2–1.2)
Total Protein: 7.5 g/dL (ref 6.0–8.3)

## 2014-06-19 LAB — CBC
HCT: 40.7 % (ref 36.0–46.0)
HEMOGLOBIN: 13.7 g/dL (ref 12.0–15.0)
MCHC: 33.6 g/dL (ref 30.0–36.0)
MCV: 86.5 fl (ref 78.0–100.0)
Platelets: 261 10*3/uL (ref 150.0–400.0)
RBC: 4.7 Mil/uL (ref 3.87–5.11)
RDW: 13.6 % (ref 11.5–15.5)
WBC: 8.1 10*3/uL (ref 4.0–10.5)

## 2014-06-19 LAB — LDL CHOLESTEROL, DIRECT: Direct LDL: 107 mg/dL

## 2014-06-19 LAB — VITAMIN D 25 HYDROXY (VIT D DEFICIENCY, FRACTURES): VITD: 22.46 ng/mL — AB (ref 30.00–100.00)

## 2014-06-23 ENCOUNTER — Ambulatory Visit (INDEPENDENT_AMBULATORY_CARE_PROVIDER_SITE_OTHER): Payer: Medicare Other | Admitting: Endocrinology

## 2014-06-23 ENCOUNTER — Encounter: Payer: Self-pay | Admitting: Endocrinology

## 2014-06-23 VITALS — BP 144/82 | HR 83 | Temp 98.6°F | Resp 14 | Ht 61.25 in | Wt 150.4 lb

## 2014-06-23 DIAGNOSIS — E7800 Pure hypercholesterolemia, unspecified: Secondary | ICD-10-CM

## 2014-06-23 DIAGNOSIS — E063 Autoimmune thyroiditis: Secondary | ICD-10-CM

## 2014-06-23 DIAGNOSIS — M810 Age-related osteoporosis without current pathological fracture: Secondary | ICD-10-CM

## 2014-06-23 DIAGNOSIS — Z1239 Encounter for other screening for malignant neoplasm of breast: Secondary | ICD-10-CM

## 2014-06-23 DIAGNOSIS — E038 Other specified hypothyroidism: Secondary | ICD-10-CM

## 2014-06-23 NOTE — Patient Instructions (Signed)
Double dose of Vitamin D3

## 2014-06-23 NOTE — Progress Notes (Signed)
Patient ID: [name] [name], female   DOB: 10/01/1945, 68 y.o.   MRN: [id number]     Chief complaint: Followup of previous problems  History of Present Illness:  HYPERLIPIDEMIA:   She has had long-standing hyperlipidemia, baseline LDL about 176 She has had intolerances with several drugs and inadequate control with Livalo Has been on pravastatin since about 2011  In 6/15 her LDL was 138 but she thinks she has started watching her diet overall better and exercising regularly.  Lab Results  Component Value Date   CHOL 212* 03/06/2014   HDL 39.50 03/06/2014   LDLCALC 138* 03/06/2014   LDLDIRECT 107.0 06/19/2014   TRIG 172.0* 03/06/2014   CHOLHDL 5 03/06/2014   Also has had difficulty losing weight but this is slightly better now   Wt Readings from Last 3 Encounters:  06/23/14 150 lb 6.4 oz (68.221 kg)  03/09/14 153 lb (69.4 kg)  08/30/13 148 lb (67.132 kg)    HYPOTHYROIDISM:  She has had mild hypothyroidism since about 2007 and has required only low doses for supplementation with good control Currently does not feel unusually tired and her last TSH was normal She is quite compliant with her medication before breakfast  Also has a previous history of colloid nodule removed in 1986  Lab Results  Component Value Date   FREET4 1.02 03/06/2014   TSH 1.21 03/06/2014       Medication List       This list is accurate as of: 06/23/14  1:13 PM.  Always use your most recent med list.               levothyroxine 125 MCG tablet  Commonly known as:  SYNTHROID, LEVOTHROID  Take 1/2 tablet by mouth once daily on an empty stomach     LORazepam 1 MG tablet  Commonly known as:  ATIVAN  Take 1 tablet (1 mg total) by mouth 2 (two) times daily as needed for anxiety.     pantoprazole 20 MG tablet  Commonly known as:  PROTONIX  Take 1 tablet (20 mg total) by mouth daily.     PARoxetine 10 MG tablet  Commonly known as:  PAXIL  Take 2 tablets (20 mg total) by mouth every morning.      pravastatin 80 MG tablet  Commonly known as:  PRAVACHOL  Take 1 tablet (80 mg total) by mouth daily.        Allergies:  Allergies  Allergen Reactions  . Omeprazole     REACTION: Reaction not known  . Penicillins     REACTION: Reaction not known  . Sulfonamide Derivatives     REACTION: Reaction not known    Past Medical History  Diagnosis Date  . Anxiety   . Hypothyroidism   . Premature atrial beat     per pt - she can go into "premature atrial tachycardia" if she bends over too quickly- no medications needed    Past Surgical History  Procedure Laterality Date  . Excisional hemorrhoidectomy  1974  . Tonsillectomy  1965  . Thyroidectomy  1986  . Breast biopsy  1980's    x2; negative  . Carotid endarterectomy  2012  . Carpal tunnel release      bilat at different times    Family History  Problem Relation Age of Onset  . Colon cancer Neg Hx   . Pancreatic cancer Neg Hx   . Stomach cancer Neg Hx   . Esophageal cancer Neg Hx  Social History:  reports that she has quit smoking. She has never used smokeless tobacco. She reports that she does not drink alcohol or use illicit drugs.  Review of Systems   OSTEOPENIA: She had a T score of -2.4 in 2013, previous history is not available, has not been treated. Previously had been followed by gynecologist She is on unknown dose of vitamin D Current level is relatively low at 22.5  White coat syndrome: Usually has higher blood pressure readings at the office. BP at home 120-130/ 70-75  She has had long-standing anxiety and depression with marked improvement on Paxil.  Since 2006 has taken only 10 mg and feels good.  Taking Ativan mostly to help sleep  LABS:  Appointment on 06/19/2014  Component Date Value Ref Range Status  . Sodium 06/19/2014 135  135 - 145 mEq/L Final  . Potassium 06/19/2014 4.1  3.5 - 5.1 mEq/L Final  . Chloride 06/19/2014 105  96 - 112 mEq/L Final  . CO2 06/19/2014 21  19 - 32 mEq/L Final  .  Glucose, Bld 06/19/2014 97  70 - 99 mg/dL Final  . BUN 06/19/2014 10  6 - 23 mg/dL Final  . Creatinine, Ser 06/19/2014 0.7  0.4 - 1.2 mg/dL Final  . Total Bilirubin 06/19/2014 0.3  0.2 - 1.2 mg/dL Final  . Alkaline Phosphatase 06/19/2014 114  39 - 117 U/L Final  . AST 06/19/2014 42* 0 - 37 U/L Final  . ALT 06/19/2014 26  0 - 35 U/L Final  . Total Protein 06/19/2014 7.5  6.0 - 8.3 g/dL Final  . Albumin 06/19/2014 3.9  3.5 - 5.2 g/dL Final  . Calcium 06/19/2014 8.9  8.4 - 10.5 mg/dL Final  . GFR 06/19/2014 91.71  >60.00 mL/min Final  . Direct LDL 06/19/2014 107.0   Final   Optimal:  <100 mg/dLNear or Above Optimal:  100-129 mg/dLBorderline High:  130-159 mg/dLHigh:  160-189 mg/dLVery High:  >190 mg/dL  . WBC 06/19/2014 8.1  4.0 - 10.5 K/uL Final  . RBC 06/19/2014 4.70  3.87 - 5.11 Mil/uL Final  . Platelets 06/19/2014 261.0  150.0 - 400.0 K/uL Final  . Hemoglobin 06/19/2014 13.7  12.0 - 15.0 g/dL Final  . HCT 06/19/2014 40.7  36.0 - 46.0 % Final  . MCV 06/19/2014 86.5  78.0 - 100.0 fl Final  . MCHC 06/19/2014 33.6  30.0 - 36.0 g/dL Final  . RDW 06/19/2014 13.6  11.5 - 15.5 % Final  . Color, Urine 06/19/2014 YELLOW  Yellow;Lt. Yellow Final  . APPearance 06/19/2014 CLEAR  Clear Final  . Specific Gravity, Urine 06/19/2014 1.025  1.000-1.030 Final  . pH 06/19/2014 6.0  5.0 - 8.0 Final  . Total Protein, Urine 06/19/2014 NEGATIVE  Negative Final  . Urine Glucose 06/19/2014 NEGATIVE  Negative Final  . Ketones, ur 06/19/2014 NEGATIVE  Negative Final  . Bilirubin Urine 06/19/2014 NEGATIVE  Negative Final  . Hgb urine dipstick 06/19/2014 MODERATE* Negative Final  . Urobilinogen, UA 06/19/2014 0.2  0.0 - 1.0 Final  . Leukocytes, UA 06/19/2014 NEGATIVE  Negative Final  . Nitrite 06/19/2014 NEGATIVE  Negative Final  . WBC, UA 06/19/2014 0-2/hpf  0-2/hpf Final  . RBC / HPF 06/19/2014 7-10/hpf* 0-2/hpf Final  . Squamous Epithelial / LPF 06/19/2014 Rare(0-4/hpf)  Rare(0-4/hpf) Final  . Bacteria, UA  06/19/2014 Rare(<10/hpf)* None Final  . VITD 06/19/2014 22.46* 30.00 - 100.00 ng/mL Final    EXAM:  BP 144/82  Pulse 83  Temp(Src) 98.6 F (37 C)  Resp 14  Ht 5' 1.25" (1.556 m)  Wt 150 lb 6.4 oz (68.221 kg)  BMI 28.18 kg/m2  SpO2 95%  No carotid bruits heard  Assessment/Plan:   Hyperlipidemia: LDL is improved with her watching her diet and exercising as well as mild weight loss. She will continue Pravachol 80 mg  Osteopenia/vitamin D deficiency: She will need a followup bone density Also needs to increase her vitamin D, most likely needs 2000 units if she is currently taking 1000 and she will confirm  Preventive care: She will be scheduled for mammogram in December when she will be back in town   [facility] 06/23/2014, 1:13 PM

## 2014-09-01 ENCOUNTER — Telehealth: Payer: Self-pay

## 2014-09-01 NOTE — Telephone Encounter (Signed)
Requested call back to discuss flu shot.

## 2014-09-05 ENCOUNTER — Other Ambulatory Visit: Payer: Self-pay | Admitting: *Deleted

## 2014-09-05 ENCOUNTER — Telehealth: Payer: Self-pay | Admitting: Endocrinology

## 2014-09-05 MED ORDER — LORAZEPAM 1 MG PO TABS: 1.0000 mg | ORAL_TABLET | Freq: Two times a day (BID) | ORAL | Status: DC | PRN

## 2014-09-05 NOTE — Telephone Encounter (Signed)
Patient states she is needing a refill of her lorazepam    Thank you

## 2014-09-11 ENCOUNTER — Other Ambulatory Visit: Payer: Self-pay | Admitting: *Deleted

## 2014-09-11 MED ORDER — PANTOPRAZOLE SODIUM 20 MG PO TBEC: 20.0000 mg | DELAYED_RELEASE_TABLET | Freq: Every day | ORAL | Status: DC

## 2014-09-11 MED ORDER — LEVOTHYROXINE SODIUM 125 MCG PO TABS: ORAL_TABLET | ORAL | Status: DC

## 2014-11-02 ENCOUNTER — Encounter: Payer: Self-pay | Admitting: Surgery

## 2014-11-24 ENCOUNTER — Other Ambulatory Visit: Payer: Self-pay | Admitting: *Deleted

## 2014-11-24 MED ORDER — PAROXETINE HCL 10 MG PO TABS: 20.0000 mg | ORAL_TABLET | ORAL | Status: DC

## 2014-12-05 ENCOUNTER — Telehealth: Payer: Self-pay | Admitting: *Deleted

## 2014-12-05 NOTE — Telephone Encounter (Signed)
Patient declines flu vaccine, she spends the summers in [place]

## 2014-12-27 ENCOUNTER — Other Ambulatory Visit (INDEPENDENT_AMBULATORY_CARE_PROVIDER_SITE_OTHER): Payer: Medicare Other

## 2014-12-27 ENCOUNTER — Other Ambulatory Visit: Payer: Medicare Other

## 2014-12-27 DIAGNOSIS — E063 Autoimmune thyroiditis: Secondary | ICD-10-CM

## 2014-12-27 DIAGNOSIS — E038 Other specified hypothyroidism: Secondary | ICD-10-CM

## 2014-12-27 DIAGNOSIS — E7800 Pure hypercholesterolemia, unspecified: Secondary | ICD-10-CM

## 2014-12-27 LAB — COMPREHENSIVE METABOLIC PANEL
ALT: 26 U/L (ref 0–35)
AST: 30 U/L (ref 0–37)
Albumin: 3.9 g/dL (ref 3.5–5.2)
Alkaline Phosphatase: 134 U/L — ABNORMAL HIGH (ref 39–117)
BILIRUBIN TOTAL: 0.4 mg/dL (ref 0.2–1.2)
BUN: 6 mg/dL (ref 6–23)
CO2: 26 meq/L (ref 19–32)
CREATININE: 0.57 mg/dL (ref 0.40–1.20)
Calcium: 9.3 mg/dL (ref 8.4–10.5)
Chloride: 105 mEq/L (ref 96–112)
GFR: 112.25 mL/min (ref >60.00)
GLUCOSE: 102 mg/dL — AB (ref 70–99)
Potassium: 3.8 mEq/L (ref 3.5–5.1)
Sodium: 138 mEq/L (ref 135–145)
Total Protein: 7 g/dL (ref 6.0–8.3)

## 2014-12-27 LAB — TSH: TSH: 3.56 u[IU]/mL (ref 0.35–4.50)

## 2014-12-28 ENCOUNTER — Other Ambulatory Visit (INDEPENDENT_AMBULATORY_CARE_PROVIDER_SITE_OTHER): Payer: Medicare Other

## 2014-12-28 DIAGNOSIS — E7800 Pure hypercholesterolemia, unspecified: Secondary | ICD-10-CM

## 2014-12-28 LAB — LIPID PANEL
Cholesterol: 197 mg/dL (ref 0–200)
HDL: 48.9 mg/dL (ref >39.00)
LDL Cholesterol: 121 mg/dL — ABNORMAL HIGH (ref 0–99)
NonHDL: 148.1
Total CHOL/HDL Ratio: 4
Triglycerides: 138 mg/dL (ref 0.0–149.0)
VLDL: 27.6 mg/dL (ref 0.0–40.0)

## 2014-12-28 NOTE — Progress Notes (Signed)
Quick Note:  Please add lipid panel, diagnosis hypercholesterolemia  ______

## 2015-01-01 ENCOUNTER — Encounter: Payer: Self-pay | Admitting: *Deleted

## 2015-01-01 ENCOUNTER — Ambulatory Visit: Payer: Medicare Other | Admitting: Endocrinology

## 2015-01-01 ENCOUNTER — Telehealth: Payer: Self-pay | Admitting: Endocrinology

## 2015-01-01 NOTE — Telephone Encounter (Signed)
Patient no showed today's appt. Please advise on how to follow up. °A. No follow up necessary. °B. Follow up urgent. Contact patient immediately. °C. Follow up necessary. Contact patient and schedule visit in ___ days. °D. Follow up advised. Contact patient and schedule visit in ____weeks. ° °

## 2015-01-03 ENCOUNTER — Ambulatory Visit (INDEPENDENT_AMBULATORY_CARE_PROVIDER_SITE_OTHER): Payer: Medicare Other | Admitting: Endocrinology

## 2015-01-03 ENCOUNTER — Encounter: Payer: Self-pay | Admitting: Endocrinology

## 2015-01-03 VITALS — BP 132/70 | HR 89 | Temp 98.0°F | Resp 14 | Ht 61.0 in | Wt 155.8 lb

## 2015-01-03 DIAGNOSIS — E038 Other specified hypothyroidism: Secondary | ICD-10-CM | POA: Diagnosis not present

## 2015-01-03 DIAGNOSIS — E559 Vitamin D deficiency, unspecified: Secondary | ICD-10-CM

## 2015-01-03 DIAGNOSIS — E063 Autoimmune thyroiditis: Secondary | ICD-10-CM

## 2015-01-03 DIAGNOSIS — R7301 Impaired fasting glucose: Secondary | ICD-10-CM | POA: Diagnosis not present

## 2015-01-03 DIAGNOSIS — E7800 Pure hypercholesterolemia, unspecified: Secondary | ICD-10-CM

## 2015-01-03 MED ORDER — PAROXETINE HCL 20 MG PO TABS: 20.0000 mg | ORAL_TABLET | ORAL | Status: DC

## 2015-01-03 NOTE — Patient Instructions (Addendum)
Restart walking when able to   Reschedule Mammogram  No more Cokes   Zantac 150 or Pepcid 10-20 mg for reflux

## 2015-01-03 NOTE — Progress Notes (Signed)
Patient ID: [name] [name], female   DOB: 10/05/1945, 69 y.o.   MRN: [id number]     Chief complaint: Followup of previous problems  History of Present Illness:  HYPERLIPIDEMIA:   She has had long-standing hyperlipidemia, baseline LDL about 176 She has had intolerances with several drugs and inadequate control with Livalo Has been on pravastatin since about 2011  Previously her LDL was 138 but this is now down to 121 Overall has been watching her diet overall better   Lab Results  Component Value Date   CHOL 197 12/28/2014   HDL 48.90 12/28/2014   LDLCALC 121* 12/28/2014   LDLDIRECT 107.0 06/19/2014   TRIG 138.0 12/28/2014   CHOLHDL 4 12/28/2014    HYPOTHYROIDISM:  She has had mild hypothyroidism since about 2007 and has required only low doses for supplementation with good control Currently does not feel unusually tired and her last TSH was normal She is quite compliant with her medication before breakfast  Also has a previous history of colloid nodule removed in 1986  Lab Results  Component Value Date   FREET4 1.02 03/06/2014   TSH 3.56 12/27/2014   TSH 1.21 03/06/2014   IMPAIRED FASTING GLUCOSE:  She has a glucose of 102 fasting.  This has been previously normal No family history of diabetes She has gained weight from lack of exercise.  She does drink 2 cans of regular Cokes a day She does have difficulty losing weight and has gained some because of lack of exercise  Wt Readings from Last 3 Encounters:  01/03/15 155 lb 12.8 oz (70.67 kg)  06/23/14 150 lb 6.4 oz (68.221 kg)  03/09/14 153 lb (69.4 kg)        Medication List       This list is accurate as of: 01/03/15  1:07 PM.  Always use your most recent med list.               levothyroxine 125 MCG tablet  Commonly known as:  SYNTHROID, LEVOTHROID  Take 1/2 tablet by mouth once daily on an empty stomach     LORazepam 1 MG tablet  Commonly known as:  ATIVAN  Take 1 tablet (1 mg total) by mouth 2  (two) times daily as needed for anxiety.     pantoprazole 20 MG tablet  Commonly known as:  PROTONIX  Take 1 tablet (20 mg total) by mouth daily.     PARoxetine 10 MG tablet  Commonly known as:  PAXIL  Take 2 tablets (20 mg total) by mouth every morning.     pravastatin 80 MG tablet  Commonly known as:  PRAVACHOL  Take 1 tablet (80 mg total) by mouth daily.        Allergies:  Allergies  Allergen Reactions  . Omeprazole     REACTION: Reaction not known  . Penicillins     REACTION: Reaction not known  . Sulfonamide Derivatives     REACTION: Reaction not known    Past Medical History  Diagnosis Date  . Anxiety   . Hypothyroidism   . Premature atrial beat     per pt - she can go into "premature atrial tachycardia" if she bends over too quickly- no medications needed    Past Surgical History  Procedure Laterality Date  . Excisional hemorrhoidectomy  1974  . Tonsillectomy  1965  . Thyroidectomy  1986  . Breast biopsy  1980's    x2; negative  . Carotid endarterectomy  2012  .  Carpal tunnel release      bilat at different times    Family History  Problem Relation Age of Onset  . Colon cancer Neg Hx   . Pancreatic cancer Neg Hx   . Stomach cancer Neg Hx   . Esophageal cancer Neg Hx     Social History:  reports that she has quit smoking. She has never used smokeless tobacco. She reports that she does not drink alcohol or use illicit drugs.  Review of Systems   OSTEOPENIA: She had a T score of -2.4 in 2013, previous history is not available, has not been treated. Previously had been followed by gynecologist She is on unknown dose of vitamin D Current level is relatively low at 22.5  White coat syndrome: Usually has higher blood pressure readings at the office. BP at home 120-130/ 70-75  She has had long-standing anxiety and depression with marked improvement on Paxil.  Since 2006 has taken only 10 mg and feels good.  Taking Ativan mostly to help  sleep  LABS:  Lab on 12/28/2014  Component Date Value Ref Range Status  . Cholesterol 12/28/2014 197  0 - 200 mg/dL Final   ATP III Classification       Desirable:  < 200 mg/dL               Borderline High:  200 - 239 mg/dL          High:  > = 240 mg/dL  . Triglycerides 12/28/2014 138.0  0.0 - 149.0 mg/dL Final   Normal:  <150 mg/dLBorderline High:  150 - 199 mg/dL  . HDL 12/28/2014 48.90  >39.00 mg/dL Final  . VLDL 12/28/2014 27.6  0.0 - 40.0 mg/dL Final  . LDL Cholesterol 12/28/2014 121* 0 - 99 mg/dL Final  . Total CHOL/HDL Ratio 12/28/2014 4   Final                  Men          Women1/2 Average Risk     3.4          3.3Average Risk          5.0          4.42X Average Risk          9.6          7.13X Average Risk          15.0          11.0                      . NonHDL 12/28/2014 148.10   Final   NOTE:  Non-HDL goal should be 30 mg/dL higher than patient's LDL goal (i.e. LDL goal of < 70 mg/dL, would have non-HDL goal of < 100 mg/dL)    EXAM:  BP 132/70 mmHg  Pulse 89  Temp(Src) 98 F (36.7 C)  Resp 14  Ht 5\' 1"  (1.549 m)  Wt 155 lb 12.8 oz (70.67 kg)  BMI 29.45 kg/m2  SpO2 97%     Assessment/Plan:   Hyperlipidemia: LDL is improved compared to the last visit and this may be primarily from watching her diet She will continue Pravachol 80 mg  Osteopenia/vitamin D deficiency: She will need a followup bone density which she has not scheduled  Vitamin D level will be rechecked on the next visit  Impaired fasting glucose: Discussed need for weight loss with consistent exercise and eliminating extra calories  with moderation of diet and limiting of regular soft drinks  Preventive care: She will call to schedule her mammogram later this summer when she returns from [place] replaced and she will continue the same dose  Acid reflux: She is concerned about long-term side effects of Protonix as mentioned on the news media and she can try taking  Zantac or Pepcid  Kista Robb 01/03/2015, 1:07 PM

## 2015-01-22 ENCOUNTER — Other Ambulatory Visit: Payer: Self-pay | Admitting: *Deleted

## 2015-01-22 MED ORDER — PAROXETINE HCL 20 MG PO TABS: 20.0000 mg | ORAL_TABLET | ORAL | Status: DC

## 2015-03-08 ENCOUNTER — Other Ambulatory Visit: Payer: Self-pay | Admitting: *Deleted

## 2015-03-08 MED ORDER — PANTOPRAZOLE SODIUM 20 MG PO TBEC: 20.0000 mg | DELAYED_RELEASE_TABLET | Freq: Every day | ORAL | Status: DC

## 2015-03-08 MED ORDER — LORAZEPAM 1 MG PO TABS: 1.0000 mg | ORAL_TABLET | Freq: Two times a day (BID) | ORAL | Status: DC | PRN

## 2015-03-08 MED ORDER — LEVOTHYROXINE SODIUM 125 MCG PO TABS: ORAL_TABLET | ORAL | Status: DC

## 2015-05-07 ENCOUNTER — Other Ambulatory Visit: Payer: Self-pay

## 2015-05-07 MED ORDER — PAROXETINE HCL 20 MG PO TABS: 20.0000 mg | ORAL_TABLET | ORAL | Status: DC

## 2015-05-22 ENCOUNTER — Other Ambulatory Visit: Payer: Self-pay | Admitting: Endocrinology

## 2015-07-03 ENCOUNTER — Other Ambulatory Visit (INDEPENDENT_AMBULATORY_CARE_PROVIDER_SITE_OTHER): Payer: Medicare Other

## 2015-07-03 DIAGNOSIS — R7301 Impaired fasting glucose: Secondary | ICD-10-CM | POA: Diagnosis not present

## 2015-07-03 DIAGNOSIS — E7800 Pure hypercholesterolemia, unspecified: Secondary | ICD-10-CM | POA: Diagnosis not present

## 2015-07-03 DIAGNOSIS — E038 Other specified hypothyroidism: Secondary | ICD-10-CM

## 2015-07-03 DIAGNOSIS — E063 Autoimmune thyroiditis: Secondary | ICD-10-CM

## 2015-07-03 LAB — HEMOGLOBIN A1C: HEMOGLOBIN A1C: 5.8 % (ref 4.6–6.5)

## 2015-07-03 LAB — COMPREHENSIVE METABOLIC PANEL
ALT: 66 U/L — AB (ref 0–35)
AST: 42 U/L — AB (ref 0–37)
Albumin: 4 g/dL (ref 3.5–5.2)
Alkaline Phosphatase: 143 U/L — ABNORMAL HIGH (ref 39–117)
BUN: 10 mg/dL (ref 6–23)
CALCIUM: 9.4 mg/dL (ref 8.4–10.5)
CO2: 29 meq/L (ref 19–32)
CREATININE: 0.59 mg/dL (ref 0.40–1.20)
Chloride: 104 mEq/L (ref 96–112)
GFR: 107.7 mL/min (ref >60.00)
GLUCOSE: 102 mg/dL — AB (ref 70–99)
Potassium: 4.3 mEq/L (ref 3.5–5.1)
Sodium: 139 mEq/L (ref 135–145)
Total Bilirubin: 0.5 mg/dL (ref 0.2–1.2)
Total Protein: 7 g/dL (ref 6.0–8.3)

## 2015-07-03 LAB — TSH: TSH: 2.41 u[IU]/mL (ref 0.35–4.50)

## 2015-07-06 ENCOUNTER — Ambulatory Visit (INDEPENDENT_AMBULATORY_CARE_PROVIDER_SITE_OTHER): Payer: Medicare Other | Admitting: Endocrinology

## 2015-07-06 ENCOUNTER — Encounter: Payer: Self-pay | Admitting: Endocrinology

## 2015-07-06 VITALS — BP 124/68 | HR 84 | Temp 98.2°F | Resp 14 | Ht 61.0 in | Wt 150.6 lb

## 2015-07-06 DIAGNOSIS — E063 Autoimmune thyroiditis: Secondary | ICD-10-CM

## 2015-07-06 DIAGNOSIS — E038 Other specified hypothyroidism: Secondary | ICD-10-CM | POA: Diagnosis not present

## 2015-07-06 DIAGNOSIS — E7800 Pure hypercholesterolemia, unspecified: Secondary | ICD-10-CM | POA: Diagnosis not present

## 2015-07-06 DIAGNOSIS — E559 Vitamin D deficiency, unspecified: Secondary | ICD-10-CM | POA: Diagnosis not present

## 2015-07-06 DIAGNOSIS — R945 Abnormal results of liver function studies: Secondary | ICD-10-CM | POA: Diagnosis not present

## 2015-07-06 NOTE — Progress Notes (Signed)
Patient ID: [name] [name], female   DOB: Jan 26, 1946, 69 y.o.   MRN: [id number]     Chief complaint: Evaluation of various problems  History of Present Illness:  ABNORMAL LIVER functions:  She has had intermittent elevation of liver functions of unclear etiology. Previous CT scans have been normal and no ultrasound report available Liver biopsy was normal in 2009 Liver functions are mildly abnormal now without any new medications or use of OTC drugs  She subjectively feels well without any nausea or decreased appetite  Lab Results  Component Value Date   ALT 66* 07/03/2015   ALT 26 12/27/2014   ALT 26 06/19/2014   ALT 36* 04/22/2010    HYPERLIPIDEMIA:   She has had long-standing  hypercholesterolemia, baseline LDL about 176 She has had intolerances with several drugs and inadequate control with Livalo Has been on pravastatin since about 2011  LDL is still not below 100 despite taking 80 mg Pravachol She tries to watch her diet fairly well and has lost some weight   Lab Results  Component Value Date   CHOL 197 12/28/2014   HDL 48.90 12/28/2014   LDLCALC 121* 12/28/2014   LDLDIRECT 107.0 06/19/2014   TRIG 138.0 12/28/2014   CHOLHDL 4 12/28/2014    HYPOTHYROIDISM:  She has had mild hypothyroidism since about 2007 and has required only low doses for supplementation with good control She feels fairly good overall and has been trying to be active Has been on a stable dose of 62.5 g She is quite compliant with her medication before breakfast  Also has a previous history of colloid nodule removed in 1986  Lab Results  Component Value Date   FREET4 1.02 03/06/2014   TSH 2.41 07/03/2015   TSH 3.56 12/27/2014   TSH 1.21 03/06/2014   IMPAIRED FASTING GLUCOSE:  She has a glucose of 102 fasting again.   A1c has been quite normal No family history of diabetes She has tried to cut back on regular soft drinks and has been able to modify her diet to lose some weight  She has been trying to be active when weather permits especially when she is in [place] Readings from Last 3 Encounters:  07/06/15 150 lb 9.6 oz (68.312 kg)  01/03/15 155 lb 12.8 oz (70.67 kg)  06/23/14 150 lb 6.4 oz (68.221 kg)   Lab Results  Component Value Date   HGBA1C 5.8 07/03/2015   Lab Results  Component Value Date   LDLCALC 121* 12/28/2014   CREATININE 0.59 07/03/2015       Medication List       This list is accurate as of: 07/06/15 11:59 PM.  Always use your most recent med list.               cholecalciferol 1000 UNITS tablet  Commonly known as:  VITAMIN D  Take 5,000 Units by mouth daily.     levothyroxine 125 MCG tablet  Commonly known as:  SYNTHROID, LEVOTHROID  Take 1/2 tablet by mouth once daily on an empty stomach     LORazepam 1 MG tablet  Commonly known as:  ATIVAN  Take 1 tablet (1 mg total) by mouth 2 (two) times daily as needed for anxiety.     PARoxetine 20 MG tablet  Commonly known as:  PAXIL  Take 1 tablet (20 mg total) by mouth every morning.     pravastatin 80 MG tablet  Commonly known as:  PRAVACHOL  take 1 tablet by  mouth once daily        Allergies:  Allergies  Allergen Reactions  . Omeprazole     REACTION: Reaction not known  . Penicillins     REACTION: Reaction not known  . Sulfonamide Derivatives     REACTION: Reaction not known    Past Medical History  Diagnosis Date  . Anxiety   . Hypothyroidism   . Premature atrial beat     per pt - she can go into "premature atrial tachycardia" if she bends over too quickly- no medications needed    Past Surgical History  Procedure Laterality Date  . Excisional hemorrhoidectomy  1974  . Tonsillectomy  1965  . Thyroidectomy  1986  . Breast biopsy  1980's    x2; negative  . Carotid endarterectomy  2012  . Carpal tunnel release      bilat at different times    Family History  Problem Relation Age of Onset  . Colon cancer Neg Hx   . Pancreatic cancer Neg Hx   .  Stomach cancer Neg Hx   . Esophageal cancer Neg Hx   . Diabetes Neg Hx   . Heart disease Father   . Stroke Father     Social History:  reports that she has quit smoking. She has never used smokeless tobacco. She reports that she does not drink alcohol or use illicit drugs.  Review of Systems   OSTEOPENIA: She had a T score of -2.4 in 2013;  has not been treated with medications.  She has not scheduled her bone density as recommended last year Previously had been followed by gynecologist  VITAMIN D deficiency:  She is on 2000 u dose of vitamin D No recent level available, Previous was relatively low at 22.5  She has had long-standing anxiety and depression with marked improvement on Paxil.  Since 2006 has taken only 10 mg and feels good.  Taking Ativan mostly to help sleep  LABS:  Appointment on 07/03/2015  Component Date Value Ref Range Status  . Hgb A1c MFr Bld 07/03/2015 5.8  4.6 - 6.5 % Final   Glycemic Control Guidelines for People with Diabetes:Non Diabetic:  <6%Goal of Therapy: <7%Additional Action Suggested:  >8%   . Sodium 07/03/2015 139  135 - 145 mEq/L Final  . Potassium 07/03/2015 4.3  3.5 - 5.1 mEq/L Final  . Chloride 07/03/2015 104  96 - 112 mEq/L Final  . CO2 07/03/2015 29  19 - 32 mEq/L Final  . Glucose, Bld 07/03/2015 102* 70 - 99 mg/dL Final  . BUN 07/03/2015 10  6 - 23 mg/dL Final  . Creatinine, Ser 07/03/2015 0.59  0.40 - 1.20 mg/dL Final  . Total Bilirubin 07/03/2015 0.5  0.2 - 1.2 mg/dL Final  . Alkaline Phosphatase 07/03/2015 143* 39 - 117 U/L Final  . AST 07/03/2015 42* 0 - 37 U/L Final  . ALT 07/03/2015 66* 0 - 35 U/L Final  . Total Protein 07/03/2015 7.0  6.0 - 8.3 g/dL Final  . Albumin 07/03/2015 4.0  3.5 - 5.2 g/dL Final  . Calcium 07/03/2015 9.4  8.4 - 10.5 mg/dL Final  . GFR 07/03/2015 107.70  >60.00 mL/min Final  . TSH 07/03/2015 2.41  0.35 - 4.50 uIU/mL Final    EXAM:  BP 124/68 mmHg  Pulse 84  Temp(Src) 98.2 F (36.8 C)  Resp 14  Ht  5\' 1"  (1.549 m)  Wt 150 lb 9.6 oz (68.312 kg)  BMI 28.47 kg/m2  SpO2 95%  Biceps reflexes are normal No pedal edema  Assessment/Plan:   ABNORMAL liver functions: Etiology unclear Will empirically stop her pravastatin and recheck in 2 months May consider reevaluation with ultrasound  HYPERLIPIDEMIA: LDL will need to be rechecked on the next visit  Osteopenia/vitamin D deficiency: She will need a followup bone density   Vitamin D level will be rechecked on the next visit, she will continue supplements  Impaired fasting glucose:  She has tried to lose weight and eliminate some of her extra calories including with things as much She is trying to walk a little and has lost 5 pounds A1c is normal We'll continue to follow, fasting glucose unchanged  Preventive care: She will need to schedule her mammogram and bone density  HYPOTHYROIDISM: Adequately treated with 62.5 g and she will continue the same dose   [facility] 07/08/2015, 8:31 PM

## 2015-09-03 ENCOUNTER — Other Ambulatory Visit (INDEPENDENT_AMBULATORY_CARE_PROVIDER_SITE_OTHER): Payer: Medicare Other

## 2015-09-03 DIAGNOSIS — E559 Vitamin D deficiency, unspecified: Secondary | ICD-10-CM | POA: Diagnosis not present

## 2015-09-03 DIAGNOSIS — R945 Abnormal results of liver function studies: Secondary | ICD-10-CM | POA: Diagnosis not present

## 2015-09-03 DIAGNOSIS — E7800 Pure hypercholesterolemia, unspecified: Secondary | ICD-10-CM | POA: Diagnosis not present

## 2015-09-03 LAB — COMPREHENSIVE METABOLIC PANEL
ALT: 19 U/L (ref 0–35)
AST: 21 U/L (ref 0–37)
Albumin: 4 g/dL (ref 3.5–5.2)
Alkaline Phosphatase: 111 U/L (ref 39–117)
BUN: 9 mg/dL (ref 6–23)
CHLORIDE: 105 meq/L (ref 96–112)
CO2: 28 meq/L (ref 19–32)
Calcium: 9.4 mg/dL (ref 8.4–10.5)
Creatinine, Ser: 0.54 mg/dL (ref 0.40–1.20)
GFR: 119.23 mL/min (ref >60.00)
GLUCOSE: 107 mg/dL — AB (ref 70–99)
POTASSIUM: 4.1 meq/L (ref 3.5–5.1)
Sodium: 140 mEq/L (ref 135–145)
Total Bilirubin: 0.4 mg/dL (ref 0.2–1.2)
Total Protein: 6.8 g/dL (ref 6.0–8.3)

## 2015-09-03 LAB — LIPID PANEL
CHOLESTEROL: 260 mg/dL — AB (ref 0–200)
HDL: 42.4 mg/dL (ref >39.00)
LDL Cholesterol: 186 mg/dL — ABNORMAL HIGH (ref 0–99)
NONHDL: 217.42
Total CHOL/HDL Ratio: 6
Triglycerides: 159 mg/dL — ABNORMAL HIGH (ref 0.0–149.0)
VLDL: 31.8 mg/dL (ref 0.0–40.0)

## 2015-09-03 LAB — VITAMIN D 25 HYDROXY (VIT D DEFICIENCY, FRACTURES): VITD: 19.17 ng/mL — AB (ref 30.00–100.00)

## 2015-09-03 LAB — CBC
HCT: 41.3 % (ref 36.0–46.0)
HEMOGLOBIN: 13.7 g/dL (ref 12.0–15.0)
MCHC: 33.1 g/dL (ref 30.0–36.0)
MCV: 86.6 fl (ref 78.0–100.0)
PLATELETS: 284 10*3/uL (ref 150.0–400.0)
RBC: 4.76 Mil/uL (ref 3.87–5.11)
RDW: 13.5 % (ref 11.5–15.5)
WBC: 7.4 10*3/uL (ref 4.0–10.5)

## 2015-09-04 ENCOUNTER — Other Ambulatory Visit: Payer: Self-pay | Admitting: Endocrinology

## 2015-09-04 ENCOUNTER — Telehealth: Payer: Self-pay | Admitting: Endocrinology

## 2015-09-04 DIAGNOSIS — Z1239 Encounter for other screening for malignant neoplasm of breast: Secondary | ICD-10-CM

## 2015-09-04 NOTE — Telephone Encounter (Signed)
Please see below.

## 2015-09-04 NOTE — Telephone Encounter (Signed)
Ordered

## 2015-09-04 NOTE — Telephone Encounter (Signed)
Pt would like an order for a mammogram please

## 2015-09-05 ENCOUNTER — Other Ambulatory Visit: Payer: Self-pay | Admitting: Endocrinology

## 2015-09-05 NOTE — Telephone Encounter (Signed)
Noted, patient is aware. 

## 2015-09-06 ENCOUNTER — Ambulatory Visit
Admission: RE | Admit: 2015-09-06 | Discharge: 2015-09-06 | Disposition: A | Discharge status: Home / Self Care | Payer: Medicare Other | Source: Ambulatory Visit | Attending: Endocrinology | Admitting: Endocrinology

## 2015-09-06 ENCOUNTER — Other Ambulatory Visit: Payer: Self-pay | Admitting: Endocrinology

## 2015-09-06 ENCOUNTER — Encounter: Payer: Self-pay | Admitting: Endocrinology

## 2015-09-06 ENCOUNTER — Ambulatory Visit (INDEPENDENT_AMBULATORY_CARE_PROVIDER_SITE_OTHER): Payer: Medicare Other | Admitting: Endocrinology

## 2015-09-06 ENCOUNTER — Encounter: Payer: Self-pay | Admitting: Cardiology

## 2015-09-06 VITALS — BP 132/74 | HR 88 | Temp 98.6°F | Ht 61.0 in | Wt 148.0 lb

## 2015-09-06 DIAGNOSIS — E7800 Pure hypercholesterolemia, unspecified: Secondary | ICD-10-CM | POA: Diagnosis not present

## 2015-09-06 DIAGNOSIS — R7301 Impaired fasting glucose: Secondary | ICD-10-CM | POA: Diagnosis not present

## 2015-09-06 DIAGNOSIS — E038 Other specified hypothyroidism: Secondary | ICD-10-CM

## 2015-09-06 DIAGNOSIS — Z1231 Encounter for screening mammogram for malignant neoplasm of breast: Secondary | ICD-10-CM

## 2015-09-06 DIAGNOSIS — E063 Autoimmune thyroiditis: Secondary | ICD-10-CM

## 2015-09-06 DIAGNOSIS — E559 Vitamin D deficiency, unspecified: Secondary | ICD-10-CM

## 2015-09-06 MED ORDER — LORAZEPAM 1 MG PO TABS: ORAL_TABLET | ORAL | Status: DC

## 2015-09-06 MED ORDER — PITAVASTATIN CALCIUM 4 MG PO TABS: ORAL_TABLET | ORAL | Status: DC

## 2015-09-06 NOTE — Progress Notes (Signed)
Patient ID: [name] [name], female   DOB: [birth year], 69 y.o.   MRN: [id number]     Chief complaint: Follow-up of various problems  History of Present Illness:  ABNORMAL LIVER functions:  She has had intermittent elevation of liver functions of unclear etiology. Previous CT scans have been normal and no ultrasound report available Liver biopsy was normal in 2009 Liver functions were abnormal on her visit in 10/16 for no apparent reason Her Pravachol was stopped empirically Her liver functions are back to normal   Lab Results  Component Value Date   ALT 19 09/03/2015   ALT 66* 07/03/2015   ALT 26 12/27/2014   ALT 26 06/19/2014    HYPERLIPIDEMIA:   She has had long-standing  hypercholesterolemia, baseline LDL about 176 She has had intolerances with all other statin drugs and inadequate control with Livalo Has been on pravastatin since about 2011 which was recently discontinued  She tries to watch her diet is usually recently   Lab Results  Component Value Date   CHOL 260* 09/03/2015   HDL 42.40 09/03/2015   LDLCALC 186* 09/03/2015   LDLDIRECT 107.0 06/19/2014   TRIG 159.0* 09/03/2015   CHOLHDL 6 09/03/2015    HYPOTHYROIDISM:  She has had mild hypothyroidism since about 2007 and has required only low doses for supplementation with good control She feels fairly good overall and has been trying to be active Has been on a stable dose of 62.5 g She is quite compliant with her medication before breakfast  Also has a previous history of colloid nodule removed in 1986  Lab Results  Component Value Date   FREET4 1.02 03/06/2014   TSH 2.41 07/03/2015   TSH 3.56 12/27/2014   TSH 1.21 03/06/2014    IMPAIRED FASTING GLUCOSE:  She has a high glucose of 107 fasting but she thinks this is from her drinking regular soft drinks the day before.   A1c has been quite normal No family history of diabetes She has tried to cut back on regular soft drinks but not  completely Has lost 2 pounds  She has been trying to be active when weather permits especially when she is in [place] Readings from Last 3 Encounters:  09/06/15 148 lb (67.132 kg)  07/06/15 150 lb 9.6 oz (68.312 kg)  01/03/15 155 lb 12.8 oz (70.67 kg)   Lab Results  Component Value Date   HGBA1C 5.8 07/03/2015   Lab Results  Component Value Date   LDLCALC 186* 09/03/2015   CREATININE 0.54 09/03/2015       Medication List       This list is accurate as of: 09/06/15  3:32 PM.  Always use your most recent med list.               cholecalciferol 1000 UNITS tablet  Commonly known as:  VITAMIN D  Take 5,000 Units by mouth daily.     levothyroxine 125 MCG tablet  Commonly known as:  SYNTHROID, LEVOTHROID  take 1/2 tablet by mouth once daily ON AN EMPTY STOMACH     LORazepam 1 MG tablet  Commonly known as:  ATIVAN  take 1 tablet by mouth twice a day if needed for anxiety     PARoxetine 20 MG tablet  Commonly known as:  PAXIL  Take 1 tablet (20 mg total) by mouth every morning.     pravastatin 80 MG tablet  Commonly known as:  PRAVACHOL  take 1 tablet by mouth once daily  Allergies:  Allergies  Allergen Reactions  . Omeprazole     REACTION: Reaction not known  . Penicillins     REACTION: Reaction not known  . Sulfonamide Derivatives     REACTION: Reaction not known    Past Medical History  Diagnosis Date  . Anxiety   . Hypothyroidism   . Premature atrial beat     per pt - she can go into "premature atrial tachycardia" if she bends over too quickly- no medications needed    Past Surgical History  Procedure Laterality Date  . Excisional hemorrhoidectomy  1974  . Tonsillectomy  1965  . Thyroidectomy  1986  . Breast biopsy  1980's    x2; negative  . Carotid endarterectomy  2012  . Carpal tunnel release      bilat at different times    Family History  Problem Relation Age of Onset  . Colon cancer Neg Hx   . Pancreatic cancer Neg Hx    . Stomach cancer Neg Hx   . Esophageal cancer Neg Hx   . Diabetes Neg Hx   . Heart disease Father   . Stroke Father     Social History:  reports that she has quit smoking. She has never used smokeless tobacco. She reports that she does not drink alcohol or use illicit drugs.  Review of Systems   OSTEOPENIA: She had a T score of -2.4 in 2013;  has not been treated with medications.  She has not scheduled her bone density as recommended last year Previously had been followed by gynecologist  VITAMIN D deficiency:  She is not on 2000 u dose of vitamin D that was recommended in her level is now below 20   She has had long-standing anxiety and depression with marked improvement on Paxil.   Since 2006 has taken only 10 mg and feels good.  Taking Ativan mostly to help sleep  Prescription given today for Ativan  LABS:  Lab on 09/03/2015  Component Date Value Ref Range Status  . VITD 09/03/2015 19.17* 30.00 - 100.00 ng/mL Final  . Sodium 09/03/2015 140  135 - 145 mEq/L Final  . Potassium 09/03/2015 4.1  3.5 - 5.1 mEq/L Final  . Chloride 09/03/2015 105  96 - 112 mEq/L Final  . CO2 09/03/2015 28  19 - 32 mEq/L Final  . Glucose, Bld 09/03/2015 107* 70 - 99 mg/dL Final  . BUN 09/03/2015 9  6 - 23 mg/dL Final  . Creatinine, Ser 09/03/2015 0.54  0.40 - 1.20 mg/dL Final  . Total Bilirubin 09/03/2015 0.4  0.2 - 1.2 mg/dL Final  . Alkaline Phosphatase 09/03/2015 111  39 - 117 U/L Final  . AST 09/03/2015 21  0 - 37 U/L Final  . ALT 09/03/2015 19  0 - 35 U/L Final  . Total Protein 09/03/2015 6.8  6.0 - 8.3 g/dL Final  . Albumin 09/03/2015 4.0  3.5 - 5.2 g/dL Final  . Calcium 09/03/2015 9.4  8.4 - 10.5 mg/dL Final  . GFR 09/03/2015 119.23  >60.00 mL/min Final  . Cholesterol 09/03/2015 260* 0 - 200 mg/dL Final   ATP III Classification       Desirable:  < 200 mg/dL               Borderline High:  200 - 239 mg/dL          High:  > = 240 mg/dL  . Triglycerides 09/03/2015 159.0* 0.0 - 149.0  mg/dL Final   Normal:  <  150 mg/dLBorderline High:  150 - 199 mg/dL  . HDL 09/03/2015 42.40  >39.00 mg/dL Final  . VLDL 09/03/2015 31.8  0.0 - 40.0 mg/dL Final  . LDL Cholesterol 09/03/2015 186* 0 - 99 mg/dL Final  . Total CHOL/HDL Ratio 09/03/2015 6   Final                  Men          Women1/2 Average Risk     3.4          3.3Average Risk          5.0          4.42X Average Risk          9.6          7.13X Average Risk          15.0          11.0                      . NonHDL 09/03/2015 217.42   Final   NOTE:  Non-HDL goal should be 30 mg/dL higher than patient's LDL goal (i.e. LDL goal of < 70 mg/dL, would have non-HDL goal of < 100 mg/dL)  . WBC 09/03/2015 7.4  4.0 - 10.5 K/uL Final  . RBC 09/03/2015 4.76  3.87 - 5.11 Mil/uL Final  . Platelets 09/03/2015 284.0  150.0 - 400.0 K/uL Final  . Hemoglobin 09/03/2015 13.7  12.0 - 15.0 g/dL Final  . HCT 09/03/2015 41.3  36.0 - 46.0 % Final  . MCV 09/03/2015 86.6  78.0 - 100.0 fl Final  . MCHC 09/03/2015 33.1  30.0 - 36.0 g/dL Final  . RDW 09/03/2015 13.5  11.5 - 15.5 % Final    EXAM:  BP 132/74 mmHg  Pulse 88  Temp(Src) 98.6 F (37 C) (Oral)  Ht 5\' 1"  (1.549 m)  Wt 148 lb (67.132 kg)  BMI 27.98 kg/m2  SpO2 98%   Biceps reflexes are normal No pedal edema  Assessment/Plan:   ABNORMAL liver functions: Etiology unclear Since the tests have been back to normal now with stopping Pravachol 2 months ago likely related to the statin drug  HYPERLIPIDEMIA: LDL significantly higher She will try Livalo as she did not have increased liver functions with this  Osteopenia/vitamin D deficiency: She will need a followup bone density  She will resume her vitamin D3 as discussed today  Impaired fasting glucose:  She has tried to lose weight and improve diet A1c 5.8, she thinks her fasting glucose was high from not watching her diet the day before  Preventive care: She has scheduled mammogram  [facility] 09/06/2015, 3:32 PM

## 2015-09-06 NOTE — Patient Instructions (Signed)
Vitamin D3, 2000 units daily

## 2015-09-12 ENCOUNTER — Ambulatory Visit: Payer: Medicare Other

## 2015-10-10 ENCOUNTER — Other Ambulatory Visit: Payer: Self-pay | Admitting: Endocrinology

## 2015-11-11 ENCOUNTER — Other Ambulatory Visit: Payer: Self-pay | Admitting: Endocrinology

## 2015-11-13 ENCOUNTER — Other Ambulatory Visit: Payer: Self-pay | Admitting: *Deleted

## 2015-11-13 MED ORDER — PITAVASTATIN CALCIUM 4 MG PO TABS: ORAL_TABLET | ORAL | Status: DC

## 2015-11-30 ENCOUNTER — Other Ambulatory Visit: Payer: Medicare Other

## 2015-12-05 ENCOUNTER — Ambulatory Visit: Payer: Medicare Other | Admitting: Endocrinology

## 2016-02-08 ENCOUNTER — Other Ambulatory Visit: Payer: Medicare Other

## 2016-02-09 ENCOUNTER — Other Ambulatory Visit: Payer: Self-pay | Admitting: Endocrinology

## 2016-02-12 ENCOUNTER — Other Ambulatory Visit: Payer: Medicare Other

## 2016-02-13 ENCOUNTER — Other Ambulatory Visit (INDEPENDENT_AMBULATORY_CARE_PROVIDER_SITE_OTHER): Payer: Medicare Other

## 2016-02-13 DIAGNOSIS — E038 Other specified hypothyroidism: Secondary | ICD-10-CM

## 2016-02-13 DIAGNOSIS — E063 Autoimmune thyroiditis: Secondary | ICD-10-CM

## 2016-02-13 DIAGNOSIS — R7301 Impaired fasting glucose: Secondary | ICD-10-CM

## 2016-02-13 DIAGNOSIS — E7800 Pure hypercholesterolemia, unspecified: Secondary | ICD-10-CM | POA: Diagnosis not present

## 2016-02-13 LAB — COMPREHENSIVE METABOLIC PANEL
ALT: 40 U/L — AB (ref 0–35)
AST: 19 U/L (ref 0–37)
Albumin: 4.2 g/dL (ref 3.5–5.2)
Alkaline Phosphatase: 98 U/L (ref 39–117)
BILIRUBIN TOTAL: 0.4 mg/dL (ref 0.2–1.2)
BUN: 14 mg/dL (ref 6–23)
CO2: 28 meq/L (ref 19–32)
Calcium: 9.2 mg/dL (ref 8.4–10.5)
Chloride: 102 mEq/L (ref 96–112)
Creatinine, Ser: 0.56 mg/dL (ref 0.40–1.20)
GFR: 114.18 mL/min (ref >60.00)
GLUCOSE: 90 mg/dL (ref 70–99)
Potassium: 3.8 mEq/L (ref 3.5–5.1)
SODIUM: 135 meq/L (ref 135–145)
TOTAL PROTEIN: 6.8 g/dL (ref 6.0–8.3)

## 2016-02-13 LAB — LIPID PANEL
CHOLESTEROL: 198 mg/dL (ref 0–200)
HDL: 60.2 mg/dL (ref >39.00)
LDL CALC: 119 mg/dL — AB (ref 0–99)
NonHDL: 138.2
TRIGLYCERIDES: 97 mg/dL (ref 0.0–149.0)
Total CHOL/HDL Ratio: 3
VLDL: 19.4 mg/dL (ref 0.0–40.0)

## 2016-02-13 LAB — TSH: TSH: 2.53 u[IU]/mL (ref 0.35–4.50)

## 2016-02-13 LAB — T4, FREE: FREE T4: 0.94 ng/dL (ref 0.60–1.60)

## 2016-02-14 ENCOUNTER — Ambulatory Visit (INDEPENDENT_AMBULATORY_CARE_PROVIDER_SITE_OTHER): Payer: Medicare Other | Admitting: Endocrinology

## 2016-02-14 ENCOUNTER — Encounter: Payer: Self-pay | Admitting: Endocrinology

## 2016-02-14 VITALS — BP 120/70 | HR 86 | Temp 97.8°F | Resp 14 | Ht 61.0 in | Wt 141.6 lb

## 2016-02-14 DIAGNOSIS — E7800 Pure hypercholesterolemia, unspecified: Secondary | ICD-10-CM | POA: Diagnosis not present

## 2016-02-14 DIAGNOSIS — E038 Other specified hypothyroidism: Secondary | ICD-10-CM | POA: Diagnosis not present

## 2016-02-14 DIAGNOSIS — M810 Age-related osteoporosis without current pathological fracture: Secondary | ICD-10-CM

## 2016-02-14 DIAGNOSIS — R7301 Impaired fasting glucose: Secondary | ICD-10-CM

## 2016-02-14 DIAGNOSIS — E559 Vitamin D deficiency, unspecified: Secondary | ICD-10-CM

## 2016-02-14 DIAGNOSIS — R945 Abnormal results of liver function studies: Secondary | ICD-10-CM

## 2016-02-14 DIAGNOSIS — E063 Autoimmune thyroiditis: Secondary | ICD-10-CM

## 2016-02-14 NOTE — Progress Notes (Signed)
Patient ID: [name] [name], female   DOB: Oct 12, 1945, 70 y.o.   MRN: [id number]     Chief complaint: Follow-up of various problems  History of Present Illness:  ABNORMAL LIVER functions:  She has had intermittent elevation of liver functions of unclear etiology. Previous CT scans have been normal and no ultrasound report available Liver biopsy was normal in 2009 Liver functions were abnormal on her visit in 10/16 but improved with stopping Pravachol even though she had been taking this without problems since 2011  She has been taking Livalo 4 mg for about 6 months now Her liver functions are as follows, ALT is minimally higher without increase in AST   Lab Results  Component Value Date   ALT 40* 02/13/2016   ALT 19 09/03/2015   ALT 66* 07/03/2015   ALT 26 12/27/2014    HYPERLIPIDEMIA:   She has had long-standing  hypercholesterolemia, baseline LDL about 176  She has had intolerances with all other statin drugs  Had been on pravastatin since about 2011 which was discontinued because of liver function abnormality and is now on Livalo 4 mg daily  She tries to watch her diet usually  Recently has lost weight   Lab Results  Component Value Date   CHOL 198 02/13/2016   HDL 60.20 02/13/2016   LDLCALC 119* 02/13/2016   LDLDIRECT 107.0 06/19/2014   TRIG 97.0 02/13/2016   CHOLHDL 3 02/13/2016    HYPOTHYROIDISM:  She has had mild hypothyroidism since about 2007 and has required only low doses for supplementation with good control She feels fairly good overall and has been trying to be active Has been on a stable dose of 62.5 g She is quite compliant with her medication before breakfast  Also has a previous history of colloid nodule removed in 1986  Lab Results  Component Value Date   FREET4 0.94 02/13/2016   FREET4 1.02 03/06/2014   TSH 2.53 02/13/2016   TSH 2.41 07/03/2015   TSH 3.56 12/27/2014    IMPAIRED FASTING GLUCOSE:  She Previously had a  high glucose of 107 fasting but she thinks this is from her drinking regular soft drinks the day before.   A1c has been quite normal No family history of diabetes  She has been trying to be active with regular walking now Has lost more weight   Wt Readings from Last 3 Encounters:  02/14/16 141 lb 9.6 oz (64.229 kg)  09/06/15 148 lb (67.132 kg)  07/06/15 150 lb 9.6 oz (68.312 kg)   Lab Results  Component Value Date   HGBA1C 5.8 07/03/2015   Lab Results  Component Value Date   LDLCALC 119* 02/13/2016   CREATININE 0.56 02/13/2016       Medication List       This list is accurate as of: 02/14/16  1:26 PM.  Always use your most recent med list.               cholecalciferol 1000 units tablet  Commonly known as:  VITAMIN D  Take 5,000 Units by mouth daily.     levothyroxine 125 MCG tablet  Commonly known as:  SYNTHROID, LEVOTHROID  take 1/2 tablet by mouth once daily ON AN EMPTY STOMACH     LORazepam 1 MG tablet  Commonly known as:  ATIVAN  take 1 tablet by mouth twice a day if needed for anxiety     PARoxetine 20 MG tablet  Commonly known as:  PAXIL  take 1  tablet by mouth every morning     Pitavastatin Calcium 4 MG Tabs  Commonly known as:  LIVALO  1 qd        Allergies:  Allergies  Allergen Reactions  . Omeprazole     REACTION: Reaction not known  . Penicillins     REACTION: Reaction not known  . Sulfonamide Derivatives     REACTION: Reaction not known    Past Medical History  Diagnosis Date  . Anxiety   . Hypothyroidism   . Premature atrial beat     per pt - she can go into "premature atrial tachycardia" if she bends over too quickly- no medications needed    Past Surgical History  Procedure Laterality Date  . Excisional hemorrhoidectomy  1974  . Tonsillectomy  1965  . Thyroidectomy  1986  . Breast biopsy  1980's    x2; negative  . Carotid endarterectomy  2012  . Carpal tunnel release      bilat at different times    Family History    Problem Relation Age of Onset  . Colon cancer Neg Hx   . Pancreatic cancer Neg Hx   . Stomach cancer Neg Hx   . Esophageal cancer Neg Hx   . Diabetes Neg Hx   . Heart disease Father   . Stroke Father     Social History:  reports that she has quit smoking. She has never used smokeless tobacco. She reports that she does not drink alcohol or use illicit drugs.  Review of Systems   OSTEOPENIA: She had a T score of -2.4 in 2013;  has not been treated with medications.  She has not scheduled her bone density as recommended last year Previously had been followed by gynecologist  VITAMIN D deficiency:  She is  on 2000 u dose of vitamin D that was recommended when her level was below 20  She has had long-standing anxiety and depression with marked improvement on Paxil.   Since 2006 has taken only 10 mg and feels good.  Taking Ativan mostly to help sleep  Asking about slight decrease in hearing  LABS:  Lab on 02/13/2016  Component Date Value Ref Range Status  . Sodium 02/13/2016 135  135 - 145 mEq/L Final  . Potassium 02/13/2016 3.8  3.5 - 5.1 mEq/L Final  . Chloride 02/13/2016 102  96 - 112 mEq/L Final  . CO2 02/13/2016 28  19 - 32 mEq/L Final  . Glucose, Bld 02/13/2016 90  70 - 99 mg/dL Final  . BUN 02/13/2016 14  6 - 23 mg/dL Final  . Creatinine, Ser 02/13/2016 0.56  0.40 - 1.20 mg/dL Final  . Total Bilirubin 02/13/2016 0.4  0.2 - 1.2 mg/dL Final  . Alkaline Phosphatase 02/13/2016 98  39 - 117 U/L Final  . AST 02/13/2016 19  0 - 37 U/L Final  . ALT 02/13/2016 40* 0 - 35 U/L Final  . Total Protein 02/13/2016 6.8  6.0 - 8.3 g/dL Final  . Albumin 02/13/2016 4.2  3.5 - 5.2 g/dL Final  . Calcium 02/13/2016 9.2  8.4 - 10.5 mg/dL Final  . GFR 02/13/2016 114.18  >60.00 mL/min Final  . Cholesterol 02/13/2016 198  0 - 200 mg/dL Final   ATP III Classification       Desirable:  < 200 mg/dL               Borderline High:  200 - 239 mg/dL  High:  > = 240 mg/dL  . Triglycerides  02/13/2016 97.0  0.0 - 149.0 mg/dL Final   Normal:  <150 mg/dLBorderline High:  150 - 199 mg/dL  . HDL 02/13/2016 60.20  >39.00 mg/dL Final  . VLDL 02/13/2016 19.4  0.0 - 40.0 mg/dL Final  . LDL Cholesterol 02/13/2016 119* 0 - 99 mg/dL Final  . Total CHOL/HDL Ratio 02/13/2016 3   Final                  Men          Women1/2 Average Risk     3.4          3.3Average Risk          5.0          4.42X Average Risk          9.6          7.13X Average Risk          15.0          11.0                      . NonHDL 02/13/2016 138.20   Final   NOTE:  Non-HDL goal should be 30 mg/dL higher than patient's LDL goal (i.e. LDL goal of < 70 mg/dL, would have non-HDL goal of < 100 mg/dL)  . TSH 02/13/2016 2.53  0.35 - 4.50 uIU/mL Final  . Free T4 02/13/2016 0.94  0.60 - 1.60 ng/dL Final    EXAM:  BP 120/70 mmHg  Pulse 86  Temp(Src) 97.8 F (36.6 C)  Resp 14  Ht 5\' 1"  (1.549 m)  Wt 141 lb 9.6 oz (64.229 kg)  BMI 26.77 kg/m2  SpO2 95%  The ear canals are normal  Assessment/Plan:   ABNORMAL liver functions: Again likely to be from statin drugs and has him minimal increase in ALT with Livalo She has been on 6 months of treatment with this and will continue to watch his   HYPERLIPIDEMIA: LDL significantly better at 119 with Livalo 4 mg and she will continue this  Osteopenia/vitamin D deficiency: She will need a followup bone density  She will continue her vitamin D3 as discussed today  Impaired fasting glucose:  She has been able to lose weight and walk regularly for exercise Labs to be checked on the next visit  HYPOTHYROID: Stable with small doses of supplement, TSH normal  Nicolena Schurman 02/14/2016, 1:26 PM

## 2016-02-19 ENCOUNTER — Other Ambulatory Visit: Payer: Self-pay | Admitting: Endocrinology

## 2016-02-19 DIAGNOSIS — M810 Age-related osteoporosis without current pathological fracture: Secondary | ICD-10-CM

## 2016-03-10 ENCOUNTER — Other Ambulatory Visit: Payer: Self-pay | Admitting: Endocrinology

## 2016-03-11 NOTE — Telephone Encounter (Signed)
Ok to refill 

## 2016-03-13 NOTE — Telephone Encounter (Signed)
Lorazepam rx faxed. 

## 2016-04-07 ENCOUNTER — Other Ambulatory Visit: Payer: Self-pay | Admitting: Endocrinology

## 2016-06-03 ENCOUNTER — Other Ambulatory Visit (INDEPENDENT_AMBULATORY_CARE_PROVIDER_SITE_OTHER): Payer: Medicare Other

## 2016-06-03 DIAGNOSIS — M810 Age-related osteoporosis without current pathological fracture: Secondary | ICD-10-CM | POA: Diagnosis not present

## 2016-06-03 DIAGNOSIS — R7301 Impaired fasting glucose: Secondary | ICD-10-CM | POA: Diagnosis not present

## 2016-06-03 DIAGNOSIS — E7800 Pure hypercholesterolemia, unspecified: Secondary | ICD-10-CM | POA: Diagnosis not present

## 2016-06-03 DIAGNOSIS — E063 Autoimmune thyroiditis: Secondary | ICD-10-CM

## 2016-06-03 DIAGNOSIS — R945 Abnormal results of liver function studies: Secondary | ICD-10-CM

## 2016-06-03 DIAGNOSIS — E559 Vitamin D deficiency, unspecified: Secondary | ICD-10-CM

## 2016-06-03 DIAGNOSIS — E038 Other specified hypothyroidism: Secondary | ICD-10-CM | POA: Diagnosis not present

## 2016-06-03 LAB — COMPREHENSIVE METABOLIC PANEL
ALBUMIN: 3.8 g/dL (ref 3.5–5.2)
ALT: 13 U/L (ref 0–35)
AST: 16 U/L (ref 0–37)
Alkaline Phosphatase: 104 U/L (ref 39–117)
BILIRUBIN TOTAL: 0.5 mg/dL (ref 0.2–1.2)
BUN: 7 mg/dL (ref 6–23)
CALCIUM: 8.8 mg/dL (ref 8.4–10.5)
CO2: 29 mEq/L (ref 19–32)
CREATININE: 0.54 mg/dL (ref 0.40–1.20)
Chloride: 106 mEq/L (ref 96–112)
GFR: 118.97 mL/min (ref >60.00)
Glucose, Bld: 98 mg/dL (ref 70–99)
Potassium: 4 mEq/L (ref 3.5–5.1)
Sodium: 140 mEq/L (ref 135–145)
Total Protein: 6.6 g/dL (ref 6.0–8.3)

## 2016-06-03 LAB — CBC
HCT: 38.8 % (ref 36.0–46.0)
Hemoglobin: 13.2 g/dL (ref 12.0–15.0)
MCHC: 34 g/dL (ref 30.0–36.0)
MCV: 85.4 fl (ref 78.0–100.0)
Platelets: 251 10*3/uL (ref 150.0–400.0)
RBC: 4.55 Mil/uL (ref 3.87–5.11)
RDW: 13.3 % (ref 11.5–15.5)
WBC: 7.7 10*3/uL (ref 4.0–10.5)

## 2016-06-03 LAB — HEMOGLOBIN A1C: HEMOGLOBIN A1C: 5.8 % (ref 4.6–6.5)

## 2016-06-03 LAB — LIPID PANEL
CHOLESTEROL: 164 mg/dL (ref 0–200)
HDL: 44.4 mg/dL (ref >39.00)
LDL Cholesterol: 97 mg/dL (ref 0–99)
NonHDL: 119.73
TRIGLYCERIDES: 113 mg/dL (ref 0.0–149.0)
Total CHOL/HDL Ratio: 4
VLDL: 22.6 mg/dL (ref 0.0–40.0)

## 2016-06-03 LAB — TSH: TSH: 2.86 u[IU]/mL (ref 0.35–4.50)

## 2016-06-03 LAB — VITAMIN D 25 HYDROXY (VIT D DEFICIENCY, FRACTURES): VITD: 16 ng/mL — AB (ref 30.00–100.00)

## 2016-06-06 ENCOUNTER — Ambulatory Visit (INDEPENDENT_AMBULATORY_CARE_PROVIDER_SITE_OTHER): Payer: Medicare Other | Admitting: Endocrinology

## 2016-06-06 VITALS — BP 122/76 | HR 86 | Temp 97.8°F | Resp 14 | Ht 61.0 in | Wt 146.4 lb

## 2016-06-06 DIAGNOSIS — E559 Vitamin D deficiency, unspecified: Secondary | ICD-10-CM | POA: Diagnosis not present

## 2016-06-06 DIAGNOSIS — E038 Other specified hypothyroidism: Secondary | ICD-10-CM | POA: Diagnosis not present

## 2016-06-06 DIAGNOSIS — E7800 Pure hypercholesterolemia, unspecified: Secondary | ICD-10-CM

## 2016-06-06 DIAGNOSIS — R7301 Impaired fasting glucose: Secondary | ICD-10-CM | POA: Diagnosis not present

## 2016-06-06 DIAGNOSIS — E063 Autoimmune thyroiditis: Secondary | ICD-10-CM

## 2016-06-06 MED ORDER — PANTOPRAZOLE SODIUM 20 MG PO TBEC: 20.0000 mg | DELAYED_RELEASE_TABLET | Freq: Every day | ORAL | 1 refills | Status: DC

## 2016-06-06 NOTE — Progress Notes (Signed)
Patient ID: [name] [name], female   DOB: 1946/08/30, 70 y.o.   MRN: [id number]     Chief complaint: Follow-up of various problems  History of Present Illness:  ABNORMAL LIVER functions:  She has had intermittent elevation of liver functions of unclear etiology. Previous CT scans have been normal and no ultrasound report available Liver biopsy was normal in 2009 Liver functions were abnormal on her visit in 10/16 but improved with stopping Pravachol even though she had been taking this without problems since 2011  She has been taking Livalo 4 mg for about 10 months now Her liver functions are as follows, ALT is back to normal, previously 40   Lab Results  Component Value Date   ALT 13 06/03/2016   ALT 40 (H) 02/13/2016   ALT 19 09/03/2015   ALT 66 (H) 07/03/2015    HYPERLIPIDEMIA:   She has had long-standing  hypercholesterolemia, baseline LDL about 176  She has had intolerances with all other statin drugs  Had been on pravastatin since about 2011 which was discontinued because of liver function abnormality and is now on Livalo 4 mg daily  She tries to watch her diet usually Although less in the last couple of weeks Her LDL is below 100 now, normally has been higher   Recently has gained weight   Lab Results  Component Value Date   CHOL 164 06/03/2016   HDL 44.40 06/03/2016   LDLCALC 97 06/03/2016   LDLDIRECT 107.0 06/19/2014   TRIG 113.0 06/03/2016   CHOLHDL 4 06/03/2016    HYPOTHYROIDISM:  She has had mild hypothyroidism since about 2007 and has required only low doses for supplementation with good control She feels fairly good overall  Has been on a stable dose of 62.5 g Levothyroxine She is quite compliant with her medication before breakfast  Also has a previous history of colloid nodule removed in 1986  Lab Results  Component Value Date   FREET4 0.94 02/13/2016   FREET4 1.02 03/06/2014   TSH 2.86 06/03/2016   TSH 2.53 02/13/2016   TSH  2.41 07/03/2015    IMPAIRED FASTING GLUCOSE:  She Previously had a high glucose of 107 fasting but she thinks this is from her drinking regular soft drinks the day before.   A1c has been quite normal and consistent although in upper normal range No family history of diabetes   She has been trying to be active with regular walking    Wt Readings from Last 3 Encounters:  06/06/16 146 lb 6.4 oz (66.4 kg)  02/14/16 141 lb 9.6 oz (64.2 kg)  09/06/15 148 lb (67.1 kg)   Lab Results  Component Value Date   HGBA1C 5.8 06/03/2016   HGBA1C 5.8 07/03/2015   Lab Results  Component Value Date   LDLCALC 97 06/03/2016   CREATININE 0.54 06/03/2016       Medication List       Accurate as of 06/06/16  9:18 AM. Always use your most recent med list.          cholecalciferol 1000 units tablet Commonly known as:  VITAMIN D Take 5,000 Units by mouth daily.   levothyroxine 125 MCG tablet Commonly known as:  SYNTHROID, LEVOTHROID take 1/2 tablet by mouth once daily ON A EMPTY STOMACH   LIVALO 4 MG Tabs Generic drug:  Pitavastatin Calcium TAKE 1 TABLET DAILY   LORazepam 1 MG tablet Commonly known as:  ATIVAN take 1 tablet twice a day if needed for  anxiety   PARoxetine 20 MG tablet Commonly known as:  PAXIL take 1 tablet by mouth every morning       Allergies:  Allergies  Allergen Reactions  . Omeprazole     REACTION: Reaction not known  . Penicillins     REACTION: Reaction not known  . Sulfonamide Derivatives     REACTION: Reaction not known    Past Medical History:  Diagnosis Date  . Anxiety   . Hypothyroidism   . Premature atrial beat    per pt - she can go into "premature atrial tachycardia" if she bends over too quickly- no medications needed    Past Surgical History:  Procedure Laterality Date  . BREAST BIOPSY  1980's   x2; negative  . CAROTID ENDARTERECTOMY  2012  . CARPAL TUNNEL RELEASE     bilat at different times  . EXCISIONAL HEMORRHOIDECTOMY  1974   . THYROIDECTOMY  1986  . TONSILLECTOMY  1965    Family History  Problem Relation Age of Onset  . Colon cancer Neg Hx   . Pancreatic cancer Neg Hx   . Stomach cancer Neg Hx   . Esophageal cancer Neg Hx   . Diabetes Neg Hx   . Heart disease Father   . Stroke Father     Social History:  reports that she has quit smoking. She has never used smokeless tobacco. She reports that she does not drink alcohol or use drugs.  Review of Systems   OSTEOPENIA: She had a T score of -2.4 in 2013;  has not been treated with medications.  She has not scheduled her bone density as recommended last year  VITAMIN D deficiency:  She is  on 2000 u dose of vitamin D that was recommended when her level was below 20 She has taken this fairly regularly except the last couple of weeks but surprisingly her level is now 16  She has had long-standing anxiety and depression with marked improvement on Paxil.   Since 2006 has taken only 10 mg and feels good.   Taking Ativan mostly to help sleep     LABS:  Appointment on 06/03/2016  Component Date Value Ref Range Status  . WBC 06/03/2016 7.7  4.0 - 10.5 K/uL Final  . RBC 06/03/2016 4.55  3.87 - 5.11 Mil/uL Final  . Platelets 06/03/2016 251.0  150.0 - 400.0 K/uL Final  . Hemoglobin 06/03/2016 13.2  12.0 - 15.0 g/dL Final  . HCT 06/03/2016 38.8  36.0 - 46.0 % Final  . MCV 06/03/2016 85.4  78.0 - 100.0 fl Final  . MCHC 06/03/2016 34.0  30.0 - 36.0 g/dL Final  . RDW 06/03/2016 13.3  11.5 - 15.5 % Final  . TSH 06/03/2016 2.86  0.35 - 4.50 uIU/mL Final  . Cholesterol 06/03/2016 164  0 - 200 mg/dL Final  . Triglycerides 06/03/2016 113.0  0.0 - 149.0 mg/dL Final  . HDL 06/03/2016 44.40  >39.00 mg/dL Final  . VLDL 06/03/2016 22.6  0.0 - 40.0 mg/dL Final  . LDL Cholesterol 06/03/2016 97  0 - 99 mg/dL Final  . Total CHOL/HDL Ratio 06/03/2016 4   Final  . NonHDL 06/03/2016 119.73   Final  . Sodium 06/03/2016 140  135 - 145 mEq/L Final  . Potassium 06/03/2016  4.0  3.5 - 5.1 mEq/L Final  . Chloride 06/03/2016 106  96 - 112 mEq/L Final  . CO2 06/03/2016 29  19 - 32 mEq/L Final  . Glucose, Bld 06/03/2016 98  70 - 99 mg/dL Final  . BUN 06/03/2016 7  6 - 23 mg/dL Final  . Creatinine, Ser 06/03/2016 0.54  0.40 - 1.20 mg/dL Final  . Total Bilirubin 06/03/2016 0.5  0.2 - 1.2 mg/dL Final  . Alkaline Phosphatase 06/03/2016 104  39 - 117 U/L Final  . AST 06/03/2016 16  0 - 37 U/L Final  . ALT 06/03/2016 13  0 - 35 U/L Final  . Total Protein 06/03/2016 6.6  6.0 - 8.3 g/dL Final  . Albumin 06/03/2016 3.8  3.5 - 5.2 g/dL Final  . Calcium 06/03/2016 8.8  8.4 - 10.5 mg/dL Final  . GFR 06/03/2016 118.97  >60.00 mL/min Final  . Hgb A1c MFr Bld 06/03/2016 5.8  4.6 - 6.5 % Final  . VITD 06/03/2016 16.00* 30.00 - 100.00 ng/mL Final    EXAM:  BP 122/76   Pulse 86   Temp 97.8 F (36.6 C)   Resp 14   Ht 5\' 1"  (1.549 m)   Wt 146 lb 6.4 oz (66.4 kg)   SpO2 97%   BMI 27.66 kg/m      Assessment/Plan:   ABNORMAL liver functions:Resolved  HYPERLIPIDEMIA: LDL significantly better at 97 with Livalo 4 mg and she will continue this  Osteopenia/vitamin D deficiency: She will need a followup bone density and will schedule  She will need to increase her vitamin D supplement up to 5,000 units since her level is still low with using 2000 units  Impaired fasting glucose:   Normal, A1c upper normal at 5.8 Needs consistent diet and exercise. Recently not following diet and exercise regimen.  BMI currently 27.6   HYPOTHYROID: Stable with small doses of supplement, TSH normal  [name] [name] 06/06/2016, 9:18 AM

## 2016-06-06 NOTE — Patient Instructions (Signed)
Vitamin d3, 4000 daily with a meal

## 2016-06-10 ENCOUNTER — Inpatient Hospital Stay: Admission: RE | Admit: 2016-06-10 | Payer: Medicare Other | Source: Ambulatory Visit

## 2016-06-12 ENCOUNTER — Other Ambulatory Visit: Payer: Self-pay | Admitting: Endocrinology

## 2016-07-07 ENCOUNTER — Other Ambulatory Visit: Payer: Self-pay | Admitting: Endocrinology

## 2016-07-14 ENCOUNTER — Other Ambulatory Visit: Payer: Self-pay | Admitting: Endocrinology

## 2016-08-15 ENCOUNTER — Other Ambulatory Visit: Payer: Self-pay | Admitting: Endocrinology

## 2016-09-11 ENCOUNTER — Other Ambulatory Visit: Payer: Self-pay | Admitting: Endocrinology

## 2016-10-15 ENCOUNTER — Other Ambulatory Visit: Payer: Self-pay | Admitting: Endocrinology

## 2016-11-15 ENCOUNTER — Other Ambulatory Visit: Payer: Self-pay | Admitting: Endocrinology

## 2016-12-01 ENCOUNTER — Other Ambulatory Visit: Payer: Medicare Other

## 2016-12-04 ENCOUNTER — Encounter: Payer: Medicare Other | Admitting: Endocrinology

## 2016-12-17 ENCOUNTER — Other Ambulatory Visit: Payer: Self-pay | Admitting: Endocrinology

## 2016-12-22 ENCOUNTER — Telehealth: Payer: Self-pay | Admitting: Endocrinology

## 2016-12-22 ENCOUNTER — Other Ambulatory Visit: Payer: Self-pay | Admitting: Endocrinology

## 2016-12-22 NOTE — Telephone Encounter (Signed)
Pt needs a 2 wk supply for the livalo 4 mg called into [facility] in [place] [phone]

## 2016-12-29 ENCOUNTER — Other Ambulatory Visit (INDEPENDENT_AMBULATORY_CARE_PROVIDER_SITE_OTHER): Payer: Medicare Other

## 2016-12-29 DIAGNOSIS — E7800 Pure hypercholesterolemia, unspecified: Secondary | ICD-10-CM

## 2016-12-29 DIAGNOSIS — E063 Autoimmune thyroiditis: Secondary | ICD-10-CM | POA: Diagnosis not present

## 2016-12-29 DIAGNOSIS — R7301 Impaired fasting glucose: Secondary | ICD-10-CM | POA: Diagnosis not present

## 2016-12-29 LAB — COMPREHENSIVE METABOLIC PANEL
ALT: 28 U/L (ref 0–35)
AST: 27 U/L (ref 0–37)
Albumin: 4.3 g/dL (ref 3.5–5.2)
Alkaline Phosphatase: 103 U/L (ref 39–117)
BUN: 9 mg/dL (ref 6–23)
CHLORIDE: 104 meq/L (ref 96–112)
CO2: 26 mEq/L (ref 19–32)
CREATININE: 0.55 mg/dL (ref 0.40–1.20)
Calcium: 9.5 mg/dL (ref 8.4–10.5)
GFR: 116.28 mL/min (ref >60.00)
GLUCOSE: 106 mg/dL — AB (ref 70–99)
POTASSIUM: 3.8 meq/L (ref 3.5–5.1)
SODIUM: 137 meq/L (ref 135–145)
TOTAL PROTEIN: 7.1 g/dL (ref 6.0–8.3)
Total Bilirubin: 0.5 mg/dL (ref 0.2–1.2)

## 2016-12-29 LAB — LIPID PANEL
CHOL/HDL RATIO: 4
Cholesterol: 197 mg/dL (ref 0–200)
HDL: 47.8 mg/dL (ref >39.00)
LDL Cholesterol: 121 mg/dL — ABNORMAL HIGH (ref 0–99)
NONHDL: 149.04
Triglycerides: 140 mg/dL (ref 0.0–149.0)
VLDL: 28 mg/dL (ref 0.0–40.0)

## 2016-12-29 LAB — TSH: TSH: 3.99 u[IU]/mL (ref 0.35–4.50)

## 2016-12-29 LAB — HEMOGLOBIN A1C: HEMOGLOBIN A1C: 6 % (ref 4.6–6.5)

## 2016-12-30 ENCOUNTER — Other Ambulatory Visit (INDEPENDENT_AMBULATORY_CARE_PROVIDER_SITE_OTHER): Payer: Medicare Other

## 2016-12-30 ENCOUNTER — Other Ambulatory Visit: Payer: Self-pay | Admitting: Endocrinology

## 2016-12-30 DIAGNOSIS — E559 Vitamin D deficiency, unspecified: Secondary | ICD-10-CM | POA: Diagnosis not present

## 2016-12-30 LAB — VITAMIN D 25 HYDROXY (VIT D DEFICIENCY, FRACTURES): VITD: 25.81 ng/mL — AB (ref 30.00–100.00)

## 2017-01-01 ENCOUNTER — Ambulatory Visit (INDEPENDENT_AMBULATORY_CARE_PROVIDER_SITE_OTHER): Payer: Medicare Other | Admitting: Endocrinology

## 2017-01-01 ENCOUNTER — Encounter: Payer: Self-pay | Admitting: Endocrinology

## 2017-01-01 VITALS — BP 128/78 | HR 81 | Ht 61.0 in | Wt 144.0 lb

## 2017-01-01 DIAGNOSIS — E7800 Pure hypercholesterolemia, unspecified: Secondary | ICD-10-CM

## 2017-01-01 DIAGNOSIS — Z0000 Encounter for general adult medical examination without abnormal findings: Secondary | ICD-10-CM | POA: Diagnosis not present

## 2017-01-01 DIAGNOSIS — R7301 Impaired fasting glucose: Secondary | ICD-10-CM

## 2017-01-01 DIAGNOSIS — E063 Autoimmune thyroiditis: Secondary | ICD-10-CM | POA: Diagnosis not present

## 2017-01-01 DIAGNOSIS — E559 Vitamin D deficiency, unspecified: Secondary | ICD-10-CM

## 2017-01-01 NOTE — Patient Instructions (Addendum)
Vitamin D3, 2000 units daily  Schedule mammogram as discussed  Bone density  Check on type of pneumonia shot  Check breasts for lumps monthly with the palm of the hand  Take  1000 units of Vitamin D3 daily and 500 mg of calcium daily  Low saturated fat diet, low intake of sugar and salt  Regular aerobic exercise 5 days a week  Regular dental and eye exams as recommended by the specialists  Make sure smoke detectors are functional at home Wear seat belts all the time

## 2017-01-01 NOTE — Progress Notes (Signed)
Patient ID: [name] [name], female   DOB: Feb 19, 1946, 71 y.o.   MRN: [id number]  Subjective:    Patient is being seen today for Medicare wellness visit and review of chronic problems.    Risk factors:   Hypercholesterolemia  Roster of Physicians Providing Medical Care to Patient:  See "care team"  Activities of Daily Living:  In the present state of health, the patient has no difficulty performing the following activities:  Preparing food and eating , Bathing, Getting dressed She has had no difficulty with walking about any balance issues In the past year the patient has not fallen or had a near fall    Safety: Has smoke detectors including carbon monoxide detectors and wears seat belts.   No excess sun exposure.  Diet and Exercise  Current exercise habits: walks about 4-5 times per week, 30 minutes Dietary issues discussed: heart healthy diet, mostly follows good diet, drinks occasional regular cokes  In 2017 had gained 5 pounds and this is down about 2 months now  Depression Screen:  See separate section  The following portions of the patient's history were reviewed and updated as appropriate: allergies, current medications, past family history, past medical history, past social history, past surgical history and problem list.   Patient Active Problem List   Diagnosis Date Noted  . HYPERCHOLESTEROLEMIA 09/15/2007  . HIATAL HERNIA 09/15/2007  . LIVER FUNCTION TESTS, ABNORMAL 09/15/2007  . ALLERGY 09/15/2007  . LIVER MASS 12/25/2006  . COLONIC POLYPS, ADENOMATOUS 09/11/2006  . HEMORRHOIDS, INTERNAL 09/11/2006     Review of Systems  Denies hearing loss, and visual changes, uses reading glasses only Has not seen her eye doctor/optician for about 3 years now    LABS:  Lab on 12/30/2016  Component Date Value Ref Range Status  . VITD 12/30/2016 25.81* 30.00 - 100.00 ng/mL Final  Lab on 12/29/2016  Component Date Value Ref Range Status  . Hgb A1c MFr Bld  12/29/2016 6.0  4.6 - 6.5 % Final  . Sodium 12/29/2016 137  135 - 145 mEq/L Final  . Potassium 12/29/2016 3.8  3.5 - 5.1 mEq/L Final  . Chloride 12/29/2016 104  96 - 112 mEq/L Final  . CO2 12/29/2016 26  19 - 32 mEq/L Final  . Glucose, Bld 12/29/2016 106* 70 - 99 mg/dL Final  . BUN 12/29/2016 9  6 - 23 mg/dL Final  . Creatinine, Ser 12/29/2016 0.55  0.40 - 1.20 mg/dL Final  . Total Bilirubin 12/29/2016 0.5  0.2 - 1.2 mg/dL Final  . Alkaline Phosphatase 12/29/2016 103  39 - 117 U/L Final  . AST 12/29/2016 27  0 - 37 U/L Final  . ALT 12/29/2016 28  0 - 35 U/L Final  . Total Protein 12/29/2016 7.1  6.0 - 8.3 g/dL Final  . Albumin 12/29/2016 4.3  3.5 - 5.2 g/dL Final  . Calcium 12/29/2016 9.5  8.4 - 10.5 mg/dL Final  . GFR 12/29/2016 116.28  >60.00 mL/min Final  . TSH 12/29/2016 3.99  0.35 - 4.50 uIU/mL Final  . Cholesterol 12/29/2016 197  0 - 200 mg/dL Final  . Triglycerides 12/29/2016 140.0  0.0 - 149.0 mg/dL Final  . HDL 12/29/2016 47.80  >39.00 mg/dL Final  . VLDL 12/29/2016 28.0  0.0 - 40.0 mg/dL Final  . LDL Cholesterol 12/29/2016 121* 0 - 99 mg/dL Final  . Total CHOL/HDL Ratio 12/29/2016 4   Final  . NonHDL 12/29/2016 149.04   Final    Objective:  Vision:  Normal grossly  Hearing: grossly normal  BP 128/78   Pulse 81   Ht 5\' 1"  (1.549 m)   Wt 144 lb (65.3 kg)   BMI 27.21 kg/m   No carotid bruit heard, right carotid endarterectomy scar  Heart sounds normal, rhythm regular Neurological:  she is able to perform  "get-up-and-go" from a sitting position Cognitive Impairment Assessment: cognition, memory and judgment appear normal.   Has Good recall, did forget 1 item recall testing.  Normal reading ability. She is  alert and oriented x 3   Assessment:   Medicare wellness evaluation done as above   Preventive parameters reviewed and actions discussed     Plan:   During the course of the visit the patient was educated and counseled about appropriate screening and  preventive services including:       Fall prevention   Screening mammography  which is due Bone densitometry screening recommended,  last 9/13,previous T score  -2.5  Diabetes screening done, needs weight loss and cutting back on carbohydrates and simple sugars because of impaired fasting glucose  Nutrition counseling done, Needs to improve her diet as above Exercise regimen: She needs to continue walking Lipid screening, done Colorectal screening: Complete, last colonoscopy 12/14 , will be doing December 2019 Regular eye exams recommended and she needs to schedule Low-dose aspirin indicated  Followup in 4 months  Vaccines / LABS Zostavax up-to-date  Pneumococcal Vaccine/Prevnar: She thinks she has had a pneumonia shot last fall with her pharmacy but she does not know which kind   Patient Instructions (the written plan) was given to the patient.  Patient Instructions  Vitamin D3, 2000 units daily  Schedule mammogram as discussed  Bone density  Check on type of pneumonia shot  Check breasts for lumps monthly with the palm of the hand  Take  1000 units of Vitamin D3 daily and 500 mg of calcium daily  Low saturated fat diet, low intake of sugar and salt  Regular aerobic exercise 5 days a week  Regular dental and eye exams as recommended by the specialists  Make sure smoke detectors are functional at home Wear seat belts all the time    [facility] 01/01/17             Separate identifiable office visit note for [name] [name]  CHIEF complaint: High blood sugar  HISTORY: She has had occasional high fasting blood sugar in the past but last year the fasting glucose was in the 90s Blood sugar is now increased at 106 fasting Although she has been trying to walk and has lost a couple of pounds she is not consistent with diet  and may be getting some sweets and regular soft drinks  Lab Results  Component Value Date   HGBA1C 6.0 12/29/2016   HGBA1C 5.8 06/03/2016    HGBA1C 5.8 07/03/2015   Lab Results  Component Value Date   LDLCALC 121 (H) 12/29/2016   CREATININE 0.55 12/29/2016    Hypercholesterolemia:  She had lipid panel done this month for follow-up of her treatment This appears to be somewhat worse, LDL has been below 100 previously However it seems like she ran out of her medication for 2 weeks before her lab was done and had taken it only for 2 days  She is on statin drug because of history of hypercholesterolemia and carotid occlusive disease, baseline LDL about 176  She has been taking Livalo without any side effect or abnormal liver functions She has  had intolerances with all other statin drugs     Lab Results  Component Value Date   CHOL 197 12/29/2016   HDL 47.80 12/29/2016   LDLCALC 121 (H) 12/29/2016   LDLDIRECT 107.0 06/19/2014   TRIG 140.0 12/29/2016   CHOLHDL 4 12/29/2016       HYPOTHYROIDISM:  She has had a follow-up of her TSH done and is asking about her lab results  She has had mild hypothyroidism since about 2007 and has required only low doses for supplementation with good control She feels fairly good overall without any fatigue Has been on a stable dose of 62.5 g Levothyroxine She is quite compliant with her medication before breakfast  Also has a previous history of colloid nodule removed in 1986  Lab Results  Component Value Date   TSH 3.99 12/29/2016   TSH 2.86 06/03/2016   TSH 2.53 02/13/2016   FREET4 0.94 02/13/2016   FREET4 1.02 03/06/2014    OSTEOPENIA/vitamin D deficiency:  She was told on her last visit to increase her dose to 5000 units of vitamin D3 but is still getting 1000 units a day and her level is still low at 25 She still has not scheduled her bone density as recommended She will do this in a couple of months when she is back in town   EXAM:  Heart sounds normal and regular No carotid bruit heard Thyroid not palpable No ankle edema present  Problem list, medications,  review of systems: See preventive section  Follow-up in 4 months  [facility] 01/01/17

## 2017-02-13 DIAGNOSIS — L03011 Cellulitis of right finger: Secondary | ICD-10-CM | POA: Diagnosis not present

## 2017-02-15 ENCOUNTER — Other Ambulatory Visit: Payer: Self-pay | Admitting: Endocrinology

## 2017-03-02 DIAGNOSIS — L03011 Cellulitis of right finger: Secondary | ICD-10-CM | POA: Diagnosis not present

## 2017-03-07 ENCOUNTER — Other Ambulatory Visit: Payer: Self-pay | Admitting: Endocrinology

## 2017-03-20 ENCOUNTER — Other Ambulatory Visit: Payer: Self-pay | Admitting: Endocrinology

## 2017-03-23 ENCOUNTER — Other Ambulatory Visit: Payer: Self-pay | Admitting: Endocrinology

## 2017-04-21 ENCOUNTER — Other Ambulatory Visit: Payer: Self-pay | Admitting: Endocrinology

## 2017-04-21 DIAGNOSIS — Z1231 Encounter for screening mammogram for malignant neoplasm of breast: Secondary | ICD-10-CM

## 2017-04-22 ENCOUNTER — Other Ambulatory Visit: Payer: Self-pay | Admitting: Endocrinology

## 2017-04-30 ENCOUNTER — Ambulatory Visit
Admission: RE | Admit: 2017-04-30 | Discharge: 2017-04-30 | Disposition: A | Discharge status: Home / Self Care | Payer: Medicare Other | Source: Ambulatory Visit | Attending: Endocrinology | Admitting: Endocrinology

## 2017-04-30 ENCOUNTER — Other Ambulatory Visit (INDEPENDENT_AMBULATORY_CARE_PROVIDER_SITE_OTHER): Payer: Medicare Other

## 2017-04-30 DIAGNOSIS — R7301 Impaired fasting glucose: Secondary | ICD-10-CM

## 2017-04-30 DIAGNOSIS — E7800 Pure hypercholesterolemia, unspecified: Secondary | ICD-10-CM

## 2017-04-30 DIAGNOSIS — Z1231 Encounter for screening mammogram for malignant neoplasm of breast: Secondary | ICD-10-CM | POA: Diagnosis not present

## 2017-04-30 LAB — COMPREHENSIVE METABOLIC PANEL
ALBUMIN: 3.8 g/dL (ref 3.5–5.2)
ALK PHOS: 94 U/L (ref 39–117)
ALT: 15 U/L (ref 0–35)
AST: 18 U/L (ref 0–37)
BUN: 5 mg/dL — ABNORMAL LOW (ref 6–23)
CHLORIDE: 102 meq/L (ref 96–112)
CO2: 28 mEq/L (ref 19–32)
Calcium: 9.3 mg/dL (ref 8.4–10.5)
Creatinine, Ser: 0.57 mg/dL (ref 0.40–1.20)
GFR: 111.48 mL/min (ref >60.00)
GLUCOSE: 102 mg/dL — AB (ref 70–99)
POTASSIUM: 3.8 meq/L (ref 3.5–5.1)
Sodium: 136 mEq/L (ref 135–145)
TOTAL PROTEIN: 6.4 g/dL (ref 6.0–8.3)
Total Bilirubin: 0.5 mg/dL (ref 0.2–1.2)

## 2017-04-30 LAB — LIPID PANEL
CHOL/HDL RATIO: 3
CHOLESTEROL: 145 mg/dL (ref 0–200)
HDL: 44.8 mg/dL (ref >39.00)
LDL Cholesterol: 71 mg/dL (ref 0–99)
NonHDL: 100.15
Triglycerides: 144 mg/dL (ref 0.0–149.0)
VLDL: 28.8 mg/dL (ref 0.0–40.0)

## 2017-05-04 ENCOUNTER — Encounter: Payer: Self-pay | Admitting: Endocrinology

## 2017-05-04 ENCOUNTER — Ambulatory Visit (INDEPENDENT_AMBULATORY_CARE_PROVIDER_SITE_OTHER): Payer: Medicare Other | Admitting: Endocrinology

## 2017-05-04 VITALS — BP 130/74 | HR 75 | Ht 61.0 in | Wt 141.0 lb

## 2017-05-04 DIAGNOSIS — E063 Autoimmune thyroiditis: Secondary | ICD-10-CM

## 2017-05-04 DIAGNOSIS — R7301 Impaired fasting glucose: Secondary | ICD-10-CM

## 2017-05-04 DIAGNOSIS — E559 Vitamin D deficiency, unspecified: Secondary | ICD-10-CM

## 2017-05-04 DIAGNOSIS — E7800 Pure hypercholesterolemia, unspecified: Secondary | ICD-10-CM

## 2017-05-04 NOTE — Patient Instructions (Addendum)
Vitamin d, take at least 4000 U daily  Take Lamisil AF

## 2017-05-04 NOTE — Progress Notes (Signed)
Patient ID: [name] [name], female   DOB: 1946-06-27, 71 y.o.   MRN: [id number]     Chief complaint: Follow-up of various problems  History of Present Illness:   HYPERLIPIDEMIA:   She has had long-standing  hypercholesterolemia, baseline LDL about 176  She has had intolerance mostly with abnormal liver functions with several statin drugs  Had been on pravastatin since about 2011 which was discontinued because of liver function abnormality and is  on Livalo 4 mg daily  She tries to watch her diet usually  Her weight has come down also Her LDL is below 100 now, relatively better than usual  ABNORMAL LIVER functions:  She has had intermittent elevation of liver functions of unclear etiology. Previous CT scans have been normal and no ultrasound report available Liver biopsy was normal in 2009 Liver functions were abnormal on her visit in 10/16 but improved with stopping Pravachol even though she had been taking this without problems since 2011  She has been taking Livalo 4 mg consistently since about 2016 Her liver functions have been more consistently normal recently    Lab Results  Component Value Date   ALT 15 04/30/2017   ALT 28 12/29/2016   ALT 13 06/03/2016   ALT 40 (H) 02/13/2016     Lab Results  Component Value Date   CHOL 145 04/30/2017   HDL 44.80 04/30/2017   LDLCALC 71 04/30/2017   LDLDIRECT 107.0 06/19/2014   TRIG 144.0 04/30/2017   CHOLHDL 3 04/30/2017    HYPOTHYROIDISM:  She has had mild hypothyroidism since about 2007 and has required only low doses for supplementation with good control She feels fairly good overall Without any unusual fatigue  Has been on a stable dose of 62.5 g Levothyroxine She is Very compliant with her medication before breakfast Although her TSH is normal it is relatively higher Previously did have some difficulty tolerating full doses of thyroxine supplementation  Also has a previous history of colloid nodule  removed in 1986  Lab Results  Component Value Date   FREET4 0.94 02/13/2016   FREET4 1.02 03/06/2014   TSH 3.99 12/29/2016   TSH 2.86 06/03/2016   TSH 2.53 02/13/2016    IMPAIRED FASTING GLUCOSE:  She Previously had a high glucose of 107 fasting And this is now slightly better at 102 A1c has been quite normal and consistent although in upper normal range No family history of diabetes   She has been trying to be active with regular walking and has lost some weight since last year   Wt Readings from Last 3 Encounters:  05/04/17 141 lb (64 kg)  01/01/17 144 lb (65.3 kg)  06/06/16 146 lb 6.4 oz (66.4 kg)   Lab Results  Component Value Date   HGBA1C 6.0 12/29/2016   HGBA1C 5.8 06/03/2016   HGBA1C 5.8 07/03/2015   Lab Results  Component Value Date   LDLCALC 71 04/30/2017   CREATININE 0.57 04/30/2017     Allergies as of 05/04/2017      Reactions   Omeprazole    REACTION: Reaction not known   Penicillins    REACTION: Reaction not known   Sulfonamide Derivatives    REACTION: Reaction not known      Medication List       Accurate as of 05/04/17  1:46 PM. Always use your most recent med list.          cholecalciferol 1000 units tablet Commonly known as:  VITAMIN D Take  1,000 Units by mouth daily.   levothyroxine 125 MCG tablet Commonly known as:  SYNTHROID, LEVOTHROID take 1/2 tablet by mouth once daily ON AN EMPTY STOMACH   LIVALO 4 MG Tabs Generic drug:  Pitavastatin Calcium TAKE 1 TABLET DAILY   LORazepam 1 MG tablet Commonly known as:  ATIVAN take 1 tablet by mouth twice a day if needed for anxiety   pantoprazole 20 MG tablet Commonly known as:  PROTONIX take 1 tablet by mouth once daily   PARoxetine 20 MG tablet Commonly known as:  PAXIL take 1 tablet by mouth every morning       Allergies:  Allergies  Allergen Reactions  . Omeprazole     REACTION: Reaction not known  . Penicillins     REACTION: Reaction not known  . Sulfonamide  Derivatives     REACTION: Reaction not known    Past Medical History:  Diagnosis Date  . Anxiety   . Hypothyroidism   . Premature atrial beat    per pt - she can go into "premature atrial tachycardia" if she bends over too quickly- no medications needed    Past Surgical History:  Procedure Laterality Date  . BREAST BIOPSY  1980's   x2; negative  . CAROTID ENDARTERECTOMY  2012  . CARPAL TUNNEL RELEASE     bilat at different times  . EXCISIONAL HEMORRHOIDECTOMY  1974  . THYROIDECTOMY  1986  . TONSILLECTOMY  1965    Family History  Problem Relation Age of Onset  . Heart disease Father   . Stroke Father   . Thyroid disease Cousin   . Colon cancer Neg Hx   . Pancreatic cancer Neg Hx   . Stomach cancer Neg Hx   . Esophageal cancer Neg Hx   . Diabetes Neg Hx     Social History:  reports that she has quit smoking. She has never used smokeless tobacco. She reports that she does not drink alcohol or use drugs.  Review of Systems   OSTEOPENIA: She had a T score of -2.4 in 2013;  has not been treated with medications.  She has not scheduled her bone density as recommended last year  VITAMIN D deficiency:  She is  on 2000 u dose of vitamin D that was recommended when her level was below 20 She has taken this fairly regularly except the last couple of weeks but surprisingly her level is now 16  She has had long-standing anxiety and depression with marked improvement on Paxil.   Since 2006 has taken only 10 mg and feels good.    Taking Ativan mostly to help sleep     LABS:  Lab on 04/30/2017  Component Date Value Ref Range Status  . Sodium 04/30/2017 136  135 - 145 mEq/L Final  . Potassium 04/30/2017 3.8  3.5 - 5.1 mEq/L Final  . Chloride 04/30/2017 102  96 - 112 mEq/L Final  . CO2 04/30/2017 28  19 - 32 mEq/L Final  . Glucose, Bld 04/30/2017 102* 70 - 99 mg/dL Final  . BUN 04/30/2017 5* 6 - 23 mg/dL Final  . Creatinine, Ser 04/30/2017 0.57  0.40 - 1.20 mg/dL Final    . Total Bilirubin 04/30/2017 0.5  0.2 - 1.2 mg/dL Final  . Alkaline Phosphatase 04/30/2017 94  39 - 117 U/L Final  . AST 04/30/2017 18  0 - 37 U/L Final  . ALT 04/30/2017 15  0 - 35 U/L Final  . Total Protein 04/30/2017 6.4  6.0 -  8.3 g/dL Final  . Albumin 04/30/2017 3.8  3.5 - 5.2 g/dL Final  . Calcium 04/30/2017 9.3  8.4 - 10.5 mg/dL Final  . GFR 04/30/2017 111.48  >60.00 mL/min Final  . Cholesterol 04/30/2017 145  0 - 200 mg/dL Final   ATP III Classification       Desirable:  < 200 mg/dL               Borderline High:  200 - 239 mg/dL          High:  > = 240 mg/dL  . Triglycerides 04/30/2017 144.0  0.0 - 149.0 mg/dL Final   Normal:  <150 mg/dLBorderline High:  150 - 199 mg/dL  . HDL 04/30/2017 44.80  >39.00 mg/dL Final  . VLDL 04/30/2017 28.8  0.0 - 40.0 mg/dL Final  . LDL Cholesterol 04/30/2017 71  0 - 99 mg/dL Final  . Total CHOL/HDL Ratio 04/30/2017 3   Final                  Men          Women1/2 Average Risk     3.4          3.3Average Risk          5.0          4.42X Average Risk          9.6          7.13X Average Risk          15.0          11.0                      . NonHDL 04/30/2017 100.15   Final   NOTE:  Non-HDL goal should be 30 mg/dL higher than patient's LDL goal (i.e. LDL goal of < 70 mg/dL, would have non-HDL goal of < 100 mg/dL)    EXAM:  BP 130/74   Pulse 75   Ht 5\' 1"  (1.549 m)   Wt 141 lb (64 kg)   SpO2 97%   BMI 26.64 kg/m    She has mild paronychia on one of her fingers  Assessment/Plan:    HYPERLIPIDEMIA: LDL significantly better at 71, previously 97 with Livalo 4 mg and she will continue this Tolerating this well and liver functions are consistently normal  Osteopenia/vitamin D deficiency: She will need a followup bone density and will schedule when she is back in town She will need to increase her vitamin D supplement up to 5,000 units, she has not remembered to do this  Impaired fasting glucose:   Fasting glucose is minimally increased at  102 Last A1c was 6% and will need to recheck on the next visit She is doing well with lifestyle changes and weight is down again  HYPOTHYROID: Not symptomatic with small doses of supplement, TSH upper normal Since she had previous difficulties tolerating full doses of levothyroxine will not increase her regimen as yet  Paronychia: Previously not resolved with antibiotic, she can try an antifungal cream also  Gilbert Narain 05/04/2017, 1:46 PM

## 2017-06-22 ENCOUNTER — Other Ambulatory Visit: Payer: Self-pay | Admitting: Endocrinology

## 2017-06-26 ENCOUNTER — Other Ambulatory Visit: Payer: Self-pay | Admitting: Endocrinology

## 2017-07-25 ENCOUNTER — Other Ambulatory Visit: Payer: Self-pay | Admitting: Endocrinology

## 2017-08-27 ENCOUNTER — Other Ambulatory Visit: Payer: Self-pay | Admitting: Endocrinology

## 2017-09-26 ENCOUNTER — Other Ambulatory Visit: Payer: Self-pay | Admitting: Endocrinology

## 2017-10-08 ENCOUNTER — Other Ambulatory Visit: Payer: Medicare Other

## 2017-10-12 ENCOUNTER — Ambulatory Visit: Payer: Medicare Other | Admitting: Endocrinology

## 2017-10-19 ENCOUNTER — Other Ambulatory Visit (INDEPENDENT_AMBULATORY_CARE_PROVIDER_SITE_OTHER): Payer: Medicare Other

## 2017-10-19 DIAGNOSIS — E559 Vitamin D deficiency, unspecified: Secondary | ICD-10-CM

## 2017-10-19 DIAGNOSIS — E7800 Pure hypercholesterolemia, unspecified: Secondary | ICD-10-CM

## 2017-10-19 DIAGNOSIS — E063 Autoimmune thyroiditis: Secondary | ICD-10-CM | POA: Diagnosis not present

## 2017-10-19 DIAGNOSIS — R7301 Impaired fasting glucose: Secondary | ICD-10-CM | POA: Diagnosis not present

## 2017-10-19 LAB — COMPREHENSIVE METABOLIC PANEL
ALK PHOS: 100 U/L (ref 39–117)
ALT: 19 U/L (ref 0–35)
AST: 19 U/L (ref 0–37)
Albumin: 4.5 g/dL (ref 3.5–5.2)
BILIRUBIN TOTAL: 0.6 mg/dL (ref 0.2–1.2)
BUN: 7 mg/dL (ref 6–23)
CALCIUM: 9.5 mg/dL (ref 8.4–10.5)
CO2: 25 mEq/L (ref 19–32)
Chloride: 102 mEq/L (ref 96–112)
Creatinine, Ser: 0.52 mg/dL (ref 0.40–1.20)
GFR: 123.77 mL/min (ref >60.00)
GLUCOSE: 129 mg/dL — AB (ref 70–99)
Potassium: 3.5 mEq/L (ref 3.5–5.1)
Sodium: 137 mEq/L (ref 135–145)
TOTAL PROTEIN: 7.6 g/dL (ref 6.0–8.3)

## 2017-10-19 LAB — TSH: TSH: 5.38 u[IU]/mL — ABNORMAL HIGH (ref 0.35–4.50)

## 2017-10-19 LAB — VITAMIN D 25 HYDROXY (VIT D DEFICIENCY, FRACTURES): VITD: 20.63 ng/mL — ABNORMAL LOW (ref 30.00–100.00)

## 2017-10-19 LAB — HEMOGLOBIN A1C: Hgb A1c MFr Bld: 5.8 % (ref 4.6–6.5)

## 2017-10-19 LAB — T4, FREE: Free T4: 0.95 ng/dL (ref 0.60–1.60)

## 2017-10-19 LAB — LDL CHOLESTEROL, DIRECT: Direct LDL: 95 mg/dL

## 2017-10-21 ENCOUNTER — Other Ambulatory Visit: Payer: Self-pay | Admitting: Endocrinology

## 2017-10-22 ENCOUNTER — Encounter: Payer: Self-pay | Admitting: Endocrinology

## 2017-10-22 ENCOUNTER — Ambulatory Visit (INDEPENDENT_AMBULATORY_CARE_PROVIDER_SITE_OTHER): Payer: Medicare Other | Admitting: Endocrinology

## 2017-10-22 VITALS — BP 152/88 | HR 84 | Ht 61.0 in | Wt 139.0 lb

## 2017-10-22 DIAGNOSIS — R7301 Impaired fasting glucose: Secondary | ICD-10-CM | POA: Diagnosis not present

## 2017-10-22 DIAGNOSIS — E559 Vitamin D deficiency, unspecified: Secondary | ICD-10-CM | POA: Diagnosis not present

## 2017-10-22 DIAGNOSIS — E063 Autoimmune thyroiditis: Secondary | ICD-10-CM

## 2017-10-22 DIAGNOSIS — E7800 Pure hypercholesterolemia, unspecified: Secondary | ICD-10-CM

## 2017-10-22 DIAGNOSIS — R5383 Other fatigue: Secondary | ICD-10-CM

## 2017-10-22 DIAGNOSIS — F419 Anxiety disorder, unspecified: Secondary | ICD-10-CM | POA: Diagnosis not present

## 2017-10-22 DIAGNOSIS — I471 Supraventricular tachycardia: Secondary | ICD-10-CM

## 2017-10-22 MED ORDER — PAROXETINE HCL 30 MG PO TABS: 30.0000 mg | ORAL_TABLET | Freq: Every day | ORAL | 3 refills | Status: DC

## 2017-10-22 NOTE — Patient Instructions (Addendum)
Paxil 30mg  daily  Vitamin D 1 daily

## 2017-10-22 NOTE — Progress Notes (Signed)
Patient ID: [name] [name], female   DOB: 03/23/1946, 72 y.o.   MRN: [id number]     Chief complaint: Follow-up of various problems and anxiety  History of Present Illness:   ANXIETY:  Her chief complaint today is feeling anxious and jittery for the last 2 weeks or so She thinks this is related to personal stress issues Also since she had an episode of palpitations she eliminated all caffeine and is asking about caffeine withdrawal She has occasionally taken extra half a tablet of Ativan which she normally takes to help her sleep but does not want to continue this She does not think she has had panic attacks or depression  She has had long-standing anxiety and depression with good control on Paxil 20 mg.     HYPERLIPIDEMIA:   She has had long-standing  hypercholesterolemia, baseline LDL about 176  She has had intolerance mostly with abnormal liver functions with several statin drugs  Had been on pravastatin since about 2011 which was discontinued because of liver function abnormality and is  on Livalo 4 mg daily  She tries to watch her diet usually  Her weight has come down also Her LDL is 95   Lab Results  Component Value Date   CHOL 145 04/30/2017   HDL 44.80 04/30/2017   LDLCALC 71 04/30/2017   LDLDIRECT 95.0 10/19/2017   TRIG 144.0 04/30/2017   CHOLHDL 3 04/30/2017     ABNORMAL LIVER functions:  She has had intermittent elevation of liver functions of unclear etiology. Previous CT scans have been normal and no ultrasound report available Liver biopsy was normal in 2009 Liver functions were abnormal on her visit in 10/16 but improved with stopping Pravachol even though she had been taking this without problems since 2011  She has been taking Livalo 4 mg consistently since about 2016 Her liver functions have been consistently normal recently   Lab Results  Component Value Date   ALT 19 10/19/2017   ALT 15 04/30/2017   ALT 28 12/29/2016   ALT 13  06/03/2016     Lab Results  Component Value Date   CHOL 145 04/30/2017   HDL 44.80 04/30/2017   LDLCALC 71 04/30/2017   LDLDIRECT 95.0 10/19/2017   TRIG 144.0 04/30/2017   CHOLHDL 3 04/30/2017    HYPOTHYROIDISM:  She has had mild hypothyroidism since about 2007 and has required only low doses for supplementation with good control She feels fairly good overall Without any unusual fatigue  Has been on a stable dose of 62.5 g Levothyroxine She is compliant with her medication before breakfast Although her TSH is relatively higher she did not complain of any unusual fatigue today  Previously did have some difficulty tolerating full doses of thyroxine supplementation  with increased anxiety  Also has a previous history of colloid nodule removed in 1986  Lab Results  Component Value Date   FREET4 0.95 10/19/2017   FREET4 0.94 02/13/2016   FREET4 1.02 03/06/2014   TSH 5.38 (H) 10/19/2017   TSH 3.99 12/29/2016   TSH 2.86 06/03/2016    IMPAIRED FASTING GLUCOSE:  She Previously had a high glucose of 107 fasting  Although her last glucose was 102 it is now 128 and she may have had some sweets the night before However she is cutting back on regular soft drinks  A1c has been quite normal and consistent, now 5.8 and improved from 6.0 last year No family history of diabetes  Again has lost some  weight since last year   Wt Readings from Last 3 Encounters:  10/22/17 139 lb (63 kg)  05/04/17 141 lb (64 kg)  01/01/17 144 lb (65.3 kg)   Lab Results  Component Value Date   HGBA1C 5.8 10/19/2017   HGBA1C 6.0 12/29/2016   HGBA1C 5.8 06/03/2016   Lab Results  Component Value Date   LDLCALC 71 04/30/2017   CREATININE 0.52 10/19/2017     Allergies as of 10/22/2017      Reactions   Omeprazole    REACTION: Reaction not known   Penicillins    REACTION: Reaction not known   Sulfonamide Derivatives    REACTION: Reaction not known      Medication List        Accurate as  of 10/22/17 11:59 PM. Always use your most recent med list.          cholecalciferol 1000 units tablet Commonly known as:  VITAMIN D Take 1,000 Units by mouth daily.   levothyroxine 125 MCG tablet Commonly known as:  SYNTHROID, LEVOTHROID TAKE 1/2 TABLET BY MOUTH ONCE DAILY ON AN EMPTY STOMACH   LIVALO 4 MG Tabs Generic drug:  Pitavastatin Calcium TAKE 1 TABLET DAILY   LORazepam 1 MG tablet Commonly known as:  ATIVAN take 1 tablet by mouth twice a day if needed for anxiety   pantoprazole 20 MG tablet Commonly known as:  PROTONIX take 1 tablet by mouth once daily   PARoxetine 30 MG tablet Commonly known as:  PAXIL Take 1 tablet (30 mg total) by mouth at bedtime.       Allergies:  Allergies  Allergen Reactions  . Omeprazole     REACTION: Reaction not known  . Penicillins     REACTION: Reaction not known  . Sulfonamide Derivatives     REACTION: Reaction not known    Past Medical History:  Diagnosis Date  . Anxiety   . Hypothyroidism   . Premature atrial beat    per pt - she can go into "premature atrial tachycardia" if she bends over too quickly- no medications needed    Past Surgical History:  Procedure Laterality Date  . BREAST BIOPSY  1980's   x2; negative  . CAROTID ENDARTERECTOMY  2012  . CARPAL TUNNEL RELEASE     bilat at different times  . EXCISIONAL HEMORRHOIDECTOMY  1974  . THYROIDECTOMY  1986  . TONSILLECTOMY  1965    Family History  Problem Relation Age of Onset  . Heart disease Father   . Stroke Father   . Thyroid disease Cousin   . Colon cancer Neg Hx   . Pancreatic cancer Neg Hx   . Stomach cancer Neg Hx   . Esophageal cancer Neg Hx   . Diabetes Neg Hx     Social History:  reports that she has quit smoking. she has never used smokeless tobacco. She reports that she does not drink alcohol or use drugs.  Review of Systems   PALPITATIONS:  She says she has had occasional episodes of SVT and has known short PR syndrome On the last  episode a couple of weeks so she thinks she had more associated dizziness and had to lie down and then do a Valsalva maneuver to relieve her symptoms. She has not seen her cardiologist regularly and on no treatment She thinks previously Inderal caused drowsiness several years ago   OSTEOPENIA: She had a T score of -2.4 in 2013;  has not been treated with medications.  She has not scheduled her bone density as recommended last year  VITAMIN D deficiency:  She is  on 2000 u dose of vitamin D that was recommended when her level was below 20 She has taken this fairly regularly except the last couple of weeks since she thinks she gets jittery when she takes this  However her level is now 28 and lower than before     LABS:  Lab on 10/19/2017  Component Date Value Ref Range Status  . VITD 10/19/2017 20.63* 30.00 - 100.00 ng/mL Final  . Direct LDL 10/19/2017 95.0  mg/dL Final   Optimal:  <100 mg/dLNear or Above Optimal:  100-129 mg/dLBorderline High:  130-159 mg/dLHigh:  160-189 mg/dLVery High:  >190 mg/dL  . Free T4 10/19/2017 0.95  0.60 - 1.60 ng/dL Final   Comment: Specimens from patients who are undergoing biotin therapy and /or ingesting biotin supplements may contain high levels of biotin.  The higher biotin concentration in these specimens interferes with this Free T4 assay.  Specimens that contain high levels  of biotin may cause false high results for this Free T4 assay.  Please interpret results in light of the total clinical presentation of the patient.    Marland Kitchen TSH 10/19/2017 5.38* 0.35 - 4.50 uIU/mL Final  . Sodium 10/19/2017 137  135 - 145 mEq/L Final  . Potassium 10/19/2017 3.5  3.5 - 5.1 mEq/L Final  . Chloride 10/19/2017 102  96 - 112 mEq/L Final  . CO2 10/19/2017 25  19 - 32 mEq/L Final  . Glucose, Bld 10/19/2017 129* 70 - 99 mg/dL Final  . BUN 10/19/2017 7  6 - 23 mg/dL Final  . Creatinine, Ser 10/19/2017 0.52  0.40 - 1.20 mg/dL Final  . Total Bilirubin 10/19/2017 0.6  0.2 -  1.2 mg/dL Final  . Alkaline Phosphatase 10/19/2017 100  39 - 117 U/L Final  . AST 10/19/2017 19  0 - 37 U/L Final  . ALT 10/19/2017 19  0 - 35 U/L Final  . Total Protein 10/19/2017 7.6  6.0 - 8.3 g/dL Final  . Albumin 10/19/2017 4.5  3.5 - 5.2 g/dL Final  . Calcium 10/19/2017 9.5  8.4 - 10.5 mg/dL Final  . GFR 10/19/2017 123.77  >60.00 mL/min Final  . Hgb A1c MFr Bld 10/19/2017 5.8  4.6 - 6.5 % Final   Glycemic Control Guidelines for People with Diabetes:Non Diabetic:  <6%Goal of Therapy: <7%Additional Action Suggested:  >8%     EXAM:  BP (!) 152/88 (BP Location: Left Arm, Patient Position: Sitting, Cuff Size: Normal)   Pulse 84   Ht 5\' 1"  (1.549 m)   Wt 139 lb (63 kg)   SpO2 97%   BMI 26.26 kg/m   Repeat blood pressure was 148/80 Heart sounds normal, no abnormal heart sounds Skin appears normal No pallor Biceps reflexes appear normal  Assessment/Plan:     ANXIETY:   She is having more anxiety and jitteriness as well as some difficulty sleeping and relaxing Reassured her that this is not related to caffeine withdrawal and may be from her reactions to recent stress She does not want to take Ativan more than once a day and although she is feeling a little better today she agrees to go to a higher dose of 30 mg of Paxil temporarily, reassured her that this is not an excessive dose, new prescription sent   Paroxysmal SVT: She had an episode with more symptoms recently otherwise symptoms are very random and infrequent EKG today  is normal Cardiac exam normal Discussed that she needs to follow-up with her cardiologist since she has short PR syndrome  HYPERLIPIDEMIA: LDL 95 with Livalo 4 mg and she will continue this Tolerating this well and liver functions are consistently normal  Osteopenia/vitamin D deficiency: She will need a followup bone density   Vitamin D deficiency: She thinks she has anxiety from taking vitamin D and is not able to increase her dosage Recommended  that she try 50,000 units monthly but she wants to try to take her vitamin D OTC again on her own with a higher dose   Impaired fasting glucose:   Fasting glucose is increased although she may not have been good on her diet the night before A1c is better at 5.8 compared to 6% She is cutting back on regular soft drinks and keeping her weight down but discussed that she needs to be more consistent with diet  ?  Hypertension: She may be reacting to her anxiety and will not start treatment as yet She will try to measure her blood pressure at home regularly  HYPOTHYROID: Although her TSH is high with her current dose she is not having any unusual fatigue and since he has more anxiety will not be able to tolerate a higher dose of supplement anyway  We will recheck on the next visit  Total visit time for evaluation and management of multiple problems =25 minutes  Elayne Snare 10/23/2017, 8:28 PM

## 2017-10-31 ENCOUNTER — Other Ambulatory Visit: Payer: Self-pay | Admitting: Endocrinology

## 2017-11-04 ENCOUNTER — Other Ambulatory Visit: Payer: Self-pay | Admitting: Endocrinology

## 2017-11-04 ENCOUNTER — Telehealth: Payer: Self-pay | Admitting: Endocrinology

## 2017-11-04 MED ORDER — LORAZEPAM 1 MG PO TABS: ORAL_TABLET | ORAL | 3 refills | Status: DC

## 2017-11-04 NOTE — Telephone Encounter (Signed)
[facility] called needing a new prescription sent to them for   LORazepam (ATIVAN) 1 MG tablet    [id number] - [place], [place] - [address]

## 2017-11-04 NOTE — Telephone Encounter (Signed)
Please advise on below  

## 2017-11-04 NOTE — Telephone Encounter (Signed)
Sent through epic

## 2017-11-19 ENCOUNTER — Telehealth: Payer: Self-pay | Admitting: Endocrinology

## 2017-11-19 MED ORDER — PAROXETINE HCL 20 MG PO TABS: 20.0000 mg | ORAL_TABLET | Freq: Every day | ORAL | 3 refills | Status: DC

## 2017-11-19 NOTE — Telephone Encounter (Signed)
Please refill 20 mg

## 2017-11-19 NOTE — Telephone Encounter (Signed)
Patient would like the dosage for the medication PARoxetine 30 MG changed to 20 mg instead of 30 mg.  Patient stated she dose better with 20 mg It is time for a refill and make sure medication refill is for the 20 mg.  [id number] - [address], [place] #:  [phone]

## 2017-11-19 NOTE — Telephone Encounter (Signed)
Rx sent. See meds. Pt informed.  

## 2017-12-03 ENCOUNTER — Other Ambulatory Visit: Payer: Self-pay | Admitting: Endocrinology

## 2017-12-03 ENCOUNTER — Telehealth: Payer: Self-pay | Admitting: Endocrinology

## 2017-12-03 NOTE — Telephone Encounter (Signed)
She had 3 refills from the prescription sent last month

## 2017-12-03 NOTE — Telephone Encounter (Signed)
Can you sent this please

## 2017-12-03 NOTE — Telephone Encounter (Signed)
LORazepam (ATIVAN) 1 MG tablet  Patient needs a refill sent over.  She could not get in touch with them for refill.      [id number] - [place], [place]

## 2017-12-03 NOTE — Telephone Encounter (Signed)
Called but mailbox was full will try again tomorrow

## 2017-12-07 ENCOUNTER — Telehealth: Payer: Self-pay | Admitting: Endocrinology

## 2017-12-07 NOTE — Telephone Encounter (Signed)
Can you please send this?

## 2017-12-07 NOTE — Telephone Encounter (Signed)
[facility]  pharmacy called stated 1 mg tab for this medication LORazepam (ATIVAN) 1 MG tablet [[id number]  is on back order. Can they do 2 mg 1/2 tabs.   please advise  [place] [phone]

## 2017-12-07 NOTE — Telephone Encounter (Signed)
They may use half of a 2 mg tablet

## 2017-12-08 NOTE — Telephone Encounter (Signed)
Called and informed the pharmacy of the advice below

## 2018-01-01 ENCOUNTER — Other Ambulatory Visit: Payer: Self-pay | Admitting: Endocrinology

## 2018-01-18 ENCOUNTER — Other Ambulatory Visit: Payer: Medicare Other

## 2018-01-20 ENCOUNTER — Ambulatory Visit: Payer: Medicare Other | Admitting: Endocrinology

## 2018-02-22 ENCOUNTER — Other Ambulatory Visit: Payer: Medicare Other

## 2018-02-23 ENCOUNTER — Other Ambulatory Visit: Payer: Self-pay | Admitting: Endocrinology

## 2018-02-23 ENCOUNTER — Other Ambulatory Visit (INDEPENDENT_AMBULATORY_CARE_PROVIDER_SITE_OTHER): Payer: Medicare Other

## 2018-02-23 DIAGNOSIS — E063 Autoimmune thyroiditis: Secondary | ICD-10-CM | POA: Diagnosis not present

## 2018-02-23 DIAGNOSIS — R7303 Prediabetes: Secondary | ICD-10-CM

## 2018-02-23 DIAGNOSIS — R5383 Other fatigue: Secondary | ICD-10-CM

## 2018-02-23 DIAGNOSIS — E559 Vitamin D deficiency, unspecified: Secondary | ICD-10-CM | POA: Diagnosis not present

## 2018-02-23 DIAGNOSIS — E7800 Pure hypercholesterolemia, unspecified: Secondary | ICD-10-CM | POA: Diagnosis not present

## 2018-02-23 LAB — LIPID PANEL
CHOL/HDL RATIO: 4
CHOLESTEROL: 215 mg/dL — AB (ref 0–200)
HDL: 52.2 mg/dL (ref >39.00)
LDL Cholesterol: 142 mg/dL — ABNORMAL HIGH (ref 0–99)
NonHDL: 163.11
TRIGLYCERIDES: 106 mg/dL (ref 0.0–149.0)
VLDL: 21.2 mg/dL (ref 0.0–40.0)

## 2018-02-23 LAB — VITAMIN D 25 HYDROXY (VIT D DEFICIENCY, FRACTURES): VITD: 23.46 ng/mL — ABNORMAL LOW (ref 30.00–100.00)

## 2018-02-23 LAB — CBC
HCT: 41.4 % (ref 36.0–46.0)
Hemoglobin: 14 g/dL (ref 12.0–15.0)
MCHC: 33.7 g/dL (ref 30.0–36.0)
MCV: 85.7 fl (ref 78.0–100.0)
PLATELETS: 268 10*3/uL (ref 150.0–400.0)
RBC: 4.83 Mil/uL (ref 3.87–5.11)
RDW: 13.6 % (ref 11.5–15.5)
WBC: 5 10*3/uL (ref 4.0–10.5)

## 2018-02-23 LAB — COMPREHENSIVE METABOLIC PANEL
ALBUMIN: 4.3 g/dL (ref 3.5–5.2)
ALT: 20 U/L (ref 0–35)
AST: 22 U/L (ref 0–37)
Alkaline Phosphatase: 92 U/L (ref 39–117)
BUN: 7 mg/dL (ref 6–23)
CALCIUM: 9.4 mg/dL (ref 8.4–10.5)
CO2: 29 meq/L (ref 19–32)
CREATININE: 0.63 mg/dL (ref 0.40–1.20)
Chloride: 103 mEq/L (ref 96–112)
GFR: 99.08 mL/min (ref >60.00)
Glucose, Bld: 118 mg/dL — ABNORMAL HIGH (ref 70–99)
Potassium: 3.7 mEq/L (ref 3.5–5.1)
SODIUM: 138 meq/L (ref 135–145)
Total Bilirubin: 0.6 mg/dL (ref 0.2–1.2)
Total Protein: 7.2 g/dL (ref 6.0–8.3)

## 2018-02-23 LAB — HEMOGLOBIN A1C: HEMOGLOBIN A1C: 5.9 % (ref 4.6–6.5)

## 2018-02-23 LAB — TSH: TSH: 3.54 u[IU]/mL (ref 0.35–4.50)

## 2018-02-25 ENCOUNTER — Ambulatory Visit (INDEPENDENT_AMBULATORY_CARE_PROVIDER_SITE_OTHER): Payer: Medicare Other | Admitting: Endocrinology

## 2018-02-25 ENCOUNTER — Encounter: Payer: Self-pay | Admitting: Endocrinology

## 2018-02-25 VITALS — BP 118/62 | HR 84 | Ht 61.0 in | Wt 140.4 lb

## 2018-02-25 DIAGNOSIS — E559 Vitamin D deficiency, unspecified: Secondary | ICD-10-CM

## 2018-02-25 DIAGNOSIS — R5383 Other fatigue: Secondary | ICD-10-CM | POA: Diagnosis not present

## 2018-02-25 DIAGNOSIS — R7301 Impaired fasting glucose: Secondary | ICD-10-CM | POA: Diagnosis not present

## 2018-02-25 DIAGNOSIS — E7800 Pure hypercholesterolemia, unspecified: Secondary | ICD-10-CM

## 2018-02-25 DIAGNOSIS — R0989 Other specified symptoms and signs involving the circulatory and respiratory systems: Secondary | ICD-10-CM

## 2018-02-25 DIAGNOSIS — E063 Autoimmune thyroiditis: Secondary | ICD-10-CM

## 2018-02-25 NOTE — Patient Instructions (Signed)
Take 2x Vitamin D3  

## 2018-02-25 NOTE — Progress Notes (Signed)
Patient ID: [name] [name], female   DOB: 1946-07-18, 72 y.o.   MRN: [id number]     Chief complaint: Follow-up of various problems and anxiety  History of Present Illness:   ANXIETY:  Her chief complaint today is feeling anxious and jittery for the last 2 weeks or so She thinks this is related to personal stress issues Also since she had an episode of palpitations she eliminated all caffeine and is asking about caffeine withdrawal She has occasionally taken extra half a tablet of Ativan which she normally takes to help her sleep but does not want to continue this She does not think she has had panic attacks or depression  She has had long-standing anxiety and depression with good control on Paxil 20 mg.     HYPERLIPIDEMIA:   She has had long-standing  hypercholesterolemia, baseline LDL about 176  She has had intolerance mostly with abnormal liver functions with several statin drugs  Had been on pravastatin since about 2011 which was discontinued because of liver function abnormality   She is currently taking Livalo 4 mg daily has been doing this without Micardis for some time  She tries to watch her diet with low fat intake  Her LDL is higher because she has been out of her medication for weeks when she was out of town   Lab Results  Component Value Date   CHOL 215 (H) 02/23/2018   HDL 52.20 02/23/2018   LDLCALC 142 (H) 02/23/2018   LDLDIRECT 95.0 10/19/2017   TRIG 106.0 02/23/2018   CHOLHDL 4 02/23/2018     ABNORMAL LIVER functions:  She has had intermittent elevation of liver functions of unclear etiology. Previous CT scans have been normal and no ultrasound report available Liver biopsy was normal in 2009 Liver functions were abnormal on her visit in 10/16 but improved with stopping Pravachol even though she had been taking this without problems since 2011  She has been taking Livalo 4 mg consistently since about 2016 Her liver functions have been  consistently normal now   Lab Results  Component Value Date   ALT 20 02/23/2018   ALT 19 10/19/2017   ALT 15 04/30/2017   ALT 28 12/29/2016     Lab Results  Component Value Date   CHOL 215 (H) 02/23/2018   HDL 52.20 02/23/2018   LDLCALC 142 (H) 02/23/2018   LDLDIRECT 95.0 10/19/2017   TRIG 106.0 02/23/2018   CHOLHDL 4 02/23/2018    HYPOTHYROIDISM:  She has had mild hypothyroidism since about 2007 and has required only low doses for supplementation with good control She feels fairly good overall Without any unusual fatigue  Has been on a stable dose of 62.5 g Levothyroxine She is compliant with her medication before breakfast Although her TSH was relatively higher on her last visit it is back down to normal again  Previously did have some difficulty tolerating full doses of thyroxine supplementation  with increased anxiety  She has a previous history of colloid nodule removed in 1986  Lab Results  Component Value Date   FREET4 0.95 10/19/2017   FREET4 0.94 02/13/2016   FREET4 1.02 03/06/2014   TSH 3.54 02/23/2018   TSH 5.38 (H) 10/19/2017   TSH 3.99 12/29/2016    IMPAIRED FASTING GLUCOSE:  She Previously had a high glucose of 107 fasting  Although her last glucose was higher at 128 previously improved She is again cutting back on regular soft drinks and has almost eliminated them now  A1c has been quite normal and consistent the prediabetic range, now 5.9 No family history of diabetes    Wt Readings from Last 3 Encounters:  02/25/18 140 lb 6.4 oz (63.7 kg)  10/22/17 139 lb (63 kg)  05/04/17 141 lb (64 kg)   Lab Results  Component Value Date   HGBA1C 5.9 02/23/2018   HGBA1C 5.8 10/19/2017   HGBA1C 6.0 12/29/2016   Lab Results  Component Value Date   LDLCALC 142 (H) 02/23/2018   CREATININE 0.63 02/23/2018     Allergies as of 02/25/2018      Reactions   Omeprazole    REACTION: Reaction not known   Penicillins    REACTION: Reaction not known     Sulfonamide Derivatives    REACTION: Reaction not known      Medication List        Accurate as of 02/25/18 10:01 AM. Always use your most recent med list.          cholecalciferol 1000 units tablet Commonly known as:  VITAMIN D Take 1,000 Units by mouth daily.   levothyroxine 125 MCG tablet Commonly known as:  SYNTHROID, LEVOTHROID TAKE 1/2 TABLET BY MOUTH ONCE DAILY ON AN EMPTY STOMACH   LIVALO 4 MG Tabs Generic drug:  Pitavastatin Calcium TAKE 1 TABLET DAILY   LORazepam 1 MG tablet Commonly known as:  ATIVAN take 1 tablet by mouth twice a day if needed for anxiety   pantoprazole 20 MG tablet Commonly known as:  PROTONIX TAKE 1 TABLET BY MOUTH ONCE DAILY   PARoxetine 20 MG tablet Commonly known as:  PAXIL Take 1 tablet (20 mg total) by mouth daily.       Allergies:  Allergies  Allergen Reactions  . Omeprazole     REACTION: Reaction not known  . Penicillins     REACTION: Reaction not known  . Sulfonamide Derivatives     REACTION: Reaction not known    Past Medical History:  Diagnosis Date  . Anxiety   . Hypothyroidism   . Premature atrial beat    per pt - she can go into "premature atrial tachycardia" if she bends over too quickly- no medications needed    Past Surgical History:  Procedure Laterality Date  . BREAST BIOPSY  1980's   x2; negative  . CAROTID ENDARTERECTOMY  2012  . CARPAL TUNNEL RELEASE     bilat at different times  . EXCISIONAL HEMORRHOIDECTOMY  1974  . THYROIDECTOMY  1986  . TONSILLECTOMY  1965    Family History  Problem Relation Age of Onset  . Heart disease Father   . Stroke Father   . Thyroid disease Cousin   . Colon cancer Neg Hx   . Pancreatic cancer Neg Hx   . Stomach cancer Neg Hx   . Esophageal cancer Neg Hx   . Diabetes Neg Hx     Social History:  reports that she has quit smoking. She has never used smokeless tobacco. She reports that she does not drink alcohol or use drugs.  Review of Systems    PALPITATIONS:  She says she has had occasional episodes of SVT and has known short PR syndrome Recently she has not had any more episodes She has not seen her cardiologist regularly and on no treatment She thinks previously Inderal caused drowsiness several years ago   OSTEOPENIA: She had a T score of -2.4 in 2013;  has not been treated with medications.  She has not scheduled her bone  density as recommended last year  VITAMIN D deficiency:  She is  on probably 2000 u dose of vitamin D that was recommended when her level was below 20 She has taken this fairly regularly   However her level is now still below 30    LABS:  Lab on 02/23/2018  Component Date Value Ref Range Status  . WBC 02/23/2018 5.0  4.0 - 10.5 K/uL Final  . RBC 02/23/2018 4.83  3.87 - 5.11 Mil/uL Final  . Platelets 02/23/2018 268.0  150.0 - 400.0 K/uL Final  . Hemoglobin 02/23/2018 14.0  12.0 - 15.0 g/dL Final  . HCT 02/23/2018 41.4  36.0 - 46.0 % Final  . MCV 02/23/2018 85.7  78.0 - 100.0 fl Final  . MCHC 02/23/2018 33.7  30.0 - 36.0 g/dL Final  . RDW 02/23/2018 13.6  11.5 - 15.5 % Final  . Sodium 02/23/2018 138  135 - 145 mEq/L Final  . Potassium 02/23/2018 3.7  3.5 - 5.1 mEq/L Final  . Chloride 02/23/2018 103  96 - 112 mEq/L Final  . CO2 02/23/2018 29  19 - 32 mEq/L Final  . Glucose, Bld 02/23/2018 118* 70 - 99 mg/dL Final  . BUN 02/23/2018 7  6 - 23 mg/dL Final  . Creatinine, Ser 02/23/2018 0.63  0.40 - 1.20 mg/dL Final  . Total Bilirubin 02/23/2018 0.6  0.2 - 1.2 mg/dL Final  . Alkaline Phosphatase 02/23/2018 92  39 - 117 U/L Final  . AST 02/23/2018 22  0 - 37 U/L Final  . ALT 02/23/2018 20  0 - 35 U/L Final  . Total Protein 02/23/2018 7.2  6.0 - 8.3 g/dL Final  . Albumin 02/23/2018 4.3  3.5 - 5.2 g/dL Final  . Calcium 02/23/2018 9.4  8.4 - 10.5 mg/dL Final  . GFR 02/23/2018 99.08  >60.00 mL/min Final  . Cholesterol 02/23/2018 215* 0 - 200 mg/dL Final   ATP III Classification       Desirable:  <  200 mg/dL               Borderline High:  200 - 239 mg/dL          High:  > = 240 mg/dL  . Triglycerides 02/23/2018 106.0  0.0 - 149.0 mg/dL Final   Normal:  <150 mg/dLBorderline High:  150 - 199 mg/dL  . HDL 02/23/2018 52.20  >39.00 mg/dL Final  . VLDL 02/23/2018 21.2  0.0 - 40.0 mg/dL Final  . LDL Cholesterol 02/23/2018 142* 0 - 99 mg/dL Final  . Total CHOL/HDL Ratio 02/23/2018 4   Final                  Men          Women1/2 Average Risk     3.4          3.3Average Risk          5.0          4.42X Average Risk          9.6          7.13X Average Risk          15.0          11.0                      . NonHDL 02/23/2018 163.11   Final   NOTE:  Non-HDL goal should be 30 mg/dL higher than patient's LDL goal (i.e. LDL goal of <  70 mg/dL, would have non-HDL goal of < 100 mg/dL)  . TSH 02/23/2018 3.54  0.35 - 4.50 uIU/mL Final  . VITD 02/23/2018 23.46* 30.00 - 100.00 ng/mL Final  . Hgb A1c MFr Bld 02/23/2018 5.9  4.6 - 6.5 % Final   Glycemic Control Guidelines for People with Diabetes:Non Diabetic:  <6%Goal of Therapy: <7%Additional Action Suggested:  >8%     EXAM:  BP 118/62 (Patient Position: Standing)   Pulse 84   Ht 5\' 1"  (1.549 m)   Wt 140 lb 6.4 oz (63.7 kg)   SpO2 98%   BMI 26.53 kg/m   Short left carotid bruit present, none on the right side  Assessment/Plan:   Left carotid bruit: She is asymptomatic but she is concerned about this because of her previous history of right carotid stenosis and will get another Doppler done last one was in 2013  HYPERLIPIDEMIA: LDL higher because she ran out of her medication and she is back on Livalo currently follow-up on the next visit Her diet is usually fairly good  Osteopenia/vitamin D deficiency: She will need a followup bone density   Vitamin D deficiency: She still has not got enough dosage change and likely what she is taking OTC  She needs to double the dose of the OTC preparation   Impaired fasting glucose:   Fasting glucose  is increased again although better Again A1c is in the normal range  HYPOTHYROID: This is mild and her TSH is back to normal now with the same regimen  Elayne Snare 02/25/2018, 10:01 AM

## 2018-03-09 ENCOUNTER — Other Ambulatory Visit: Payer: Self-pay | Admitting: Endocrinology

## 2018-03-17 DIAGNOSIS — I6522 Occlusion and stenosis of left carotid artery: Secondary | ICD-10-CM | POA: Diagnosis not present

## 2018-03-17 DIAGNOSIS — I6523 Occlusion and stenosis of bilateral carotid arteries: Secondary | ICD-10-CM | POA: Diagnosis not present

## 2018-04-02 ENCOUNTER — Other Ambulatory Visit: Payer: Self-pay | Admitting: Endocrinology

## 2018-04-10 ENCOUNTER — Other Ambulatory Visit: Payer: Self-pay | Admitting: Endocrinology

## 2018-04-14 ENCOUNTER — Other Ambulatory Visit: Payer: Self-pay | Admitting: Endocrinology

## 2018-04-14 NOTE — Telephone Encounter (Signed)
Please refill if appropriate

## 2018-04-14 NOTE — Telephone Encounter (Signed)
[facility] called and requested a refill on patients LORazepam (ATIVAN) 1 MG tablet. Thanks.

## 2018-04-15 ENCOUNTER — Other Ambulatory Visit: Payer: Self-pay

## 2018-04-15 MED ORDER — LORAZEPAM 1 MG PO TABS: ORAL_TABLET | ORAL | 3 refills | Status: DC

## 2018-04-15 NOTE — Telephone Encounter (Signed)
Please refill if appropriate

## 2018-04-15 NOTE — Telephone Encounter (Signed)
Prescription needs to be printed

## 2018-04-16 ENCOUNTER — Other Ambulatory Visit: Payer: Self-pay

## 2018-04-16 MED ORDER — LORAZEPAM 1 MG PO TABS: ORAL_TABLET | ORAL | 3 refills | Status: DC

## 2018-04-16 NOTE — Telephone Encounter (Signed)
Rx printed out for MD signature.

## 2018-04-20 ENCOUNTER — Other Ambulatory Visit: Payer: Self-pay | Admitting: Endocrinology

## 2018-04-21 NOTE — Telephone Encounter (Signed)
This has already been signed to be faxed

## 2018-04-21 NOTE — Telephone Encounter (Signed)
Is this okay to refill? 

## 2018-05-18 ENCOUNTER — Other Ambulatory Visit: Payer: Self-pay | Admitting: Endocrinology

## 2018-06-14 ENCOUNTER — Other Ambulatory Visit: Payer: Self-pay | Admitting: Endocrinology

## 2018-06-17 ENCOUNTER — Other Ambulatory Visit: Payer: Self-pay | Admitting: Endocrinology

## 2018-07-02 DIAGNOSIS — Z23 Encounter for immunization: Secondary | ICD-10-CM | POA: Diagnosis not present

## 2018-07-16 ENCOUNTER — Other Ambulatory Visit: Payer: Self-pay | Admitting: Endocrinology

## 2018-08-13 ENCOUNTER — Other Ambulatory Visit: Payer: Self-pay | Admitting: Endocrinology

## 2018-08-16 ENCOUNTER — Encounter: Payer: Self-pay | Admitting: Gastroenterology

## 2018-08-23 ENCOUNTER — Other Ambulatory Visit: Payer: Self-pay | Admitting: Endocrinology

## 2018-08-24 ENCOUNTER — Telehealth: Payer: Self-pay | Admitting: Endocrinology

## 2018-08-24 ENCOUNTER — Other Ambulatory Visit: Payer: Self-pay

## 2018-08-24 MED ORDER — PITAVASTATIN CALCIUM 4 MG PO TABS: 1.0000 | ORAL_TABLET | Freq: Every day | ORAL | 4 refills | Status: DC

## 2018-08-24 NOTE — Telephone Encounter (Signed)
Patient stated that she needs a prescription sent into the pharmacy for a 30 day supply. She stated that she had some problems with her [employer] account right now and they advises her to contact us to have this sent into her local pharmacy     LIVALO 4 MG TABS  Patient stated if we had any further questions about this to please call her  Patient Phone- [place], [place]

## 2018-08-24 NOTE — Telephone Encounter (Signed)
Rx sent 

## 2018-08-26 DIAGNOSIS — I6523 Occlusion and stenosis of bilateral carotid arteries: Secondary | ICD-10-CM | POA: Diagnosis not present

## 2018-08-26 DIAGNOSIS — I6521 Occlusion and stenosis of right carotid artery: Secondary | ICD-10-CM | POA: Diagnosis not present

## 2018-08-26 DIAGNOSIS — I6522 Occlusion and stenosis of left carotid artery: Secondary | ICD-10-CM | POA: Diagnosis not present

## 2018-09-13 ENCOUNTER — Other Ambulatory Visit: Payer: Self-pay | Admitting: Endocrinology

## 2018-09-19 ENCOUNTER — Encounter: Payer: Self-pay | Admitting: Gastroenterology

## 2018-09-20 ENCOUNTER — Other Ambulatory Visit: Payer: Medicare Other

## 2018-09-21 ENCOUNTER — Ambulatory Visit: Payer: Medicare Other | Admitting: Endocrinology

## 2018-09-21 ENCOUNTER — Other Ambulatory Visit (INDEPENDENT_AMBULATORY_CARE_PROVIDER_SITE_OTHER): Payer: Medicare Other

## 2018-09-21 DIAGNOSIS — E7800 Pure hypercholesterolemia, unspecified: Secondary | ICD-10-CM | POA: Diagnosis not present

## 2018-09-21 DIAGNOSIS — R5383 Other fatigue: Secondary | ICD-10-CM

## 2018-09-21 DIAGNOSIS — E063 Autoimmune thyroiditis: Secondary | ICD-10-CM

## 2018-09-21 LAB — CBC
HCT: 41 % (ref 36.0–46.0)
Hemoglobin: 13.6 g/dL (ref 12.0–15.0)
MCHC: 33 g/dL (ref 30.0–36.0)
MCV: 85.5 fl (ref 78.0–100.0)
PLATELETS: 262 10*3/uL (ref 150.0–400.0)
RBC: 4.8 Mil/uL (ref 3.87–5.11)
RDW: 13.5 % (ref 11.5–15.5)
WBC: 4.9 10*3/uL (ref 4.0–10.5)

## 2018-09-21 LAB — COMPREHENSIVE METABOLIC PANEL
ALT: 14 U/L (ref 0–35)
AST: 19 U/L (ref 0–37)
Albumin: 4.1 g/dL (ref 3.5–5.2)
Alkaline Phosphatase: 91 U/L (ref 39–117)
BUN: 7 mg/dL (ref 6–23)
CALCIUM: 9.4 mg/dL (ref 8.4–10.5)
CO2: 30 meq/L (ref 19–32)
Chloride: 102 mEq/L (ref 96–112)
Creatinine, Ser: 0.6 mg/dL (ref 0.40–1.20)
GFR: 104.65 mL/min (ref >60.00)
GLUCOSE: 99 mg/dL (ref 70–99)
POTASSIUM: 3.7 meq/L (ref 3.5–5.1)
Sodium: 137 mEq/L (ref 135–145)
Total Bilirubin: 0.6 mg/dL (ref 0.2–1.2)
Total Protein: 6.8 g/dL (ref 6.0–8.3)

## 2018-09-21 LAB — LIPID PANEL
Cholesterol: 176 mg/dL (ref 0–200)
HDL: 50.8 mg/dL (ref >39.00)
LDL Cholesterol: 91 mg/dL (ref 0–99)
NONHDL: 125.62
Total CHOL/HDL Ratio: 3
Triglycerides: 173 mg/dL — ABNORMAL HIGH (ref 0.0–149.0)
VLDL: 34.6 mg/dL (ref 0.0–40.0)

## 2018-09-21 LAB — TSH: TSH: 3.35 u[IU]/mL (ref 0.35–4.50)

## 2018-09-23 ENCOUNTER — Ambulatory Visit (INDEPENDENT_AMBULATORY_CARE_PROVIDER_SITE_OTHER): Payer: Medicare Other | Admitting: Endocrinology

## 2018-09-23 ENCOUNTER — Encounter: Payer: Self-pay | Admitting: Endocrinology

## 2018-09-23 VITALS — BP 130/86 | HR 90 | Ht 61.0 in | Wt 145.6 lb

## 2018-09-23 DIAGNOSIS — M8580 Other specified disorders of bone density and structure, unspecified site: Secondary | ICD-10-CM | POA: Diagnosis not present

## 2018-09-23 DIAGNOSIS — E063 Autoimmune thyroiditis: Secondary | ICD-10-CM | POA: Diagnosis not present

## 2018-09-23 DIAGNOSIS — E559 Vitamin D deficiency, unspecified: Secondary | ICD-10-CM

## 2018-09-23 DIAGNOSIS — R7303 Prediabetes: Secondary | ICD-10-CM | POA: Diagnosis not present

## 2018-09-23 DIAGNOSIS — E7800 Pure hypercholesterolemia, unspecified: Secondary | ICD-10-CM | POA: Diagnosis not present

## 2018-09-23 DIAGNOSIS — M810 Age-related osteoporosis without current pathological fracture: Secondary | ICD-10-CM

## 2018-09-23 NOTE — Patient Instructions (Addendum)
Vitamin D3 take 4000 units daily  Keep check on BP

## 2018-09-23 NOTE — Progress Notes (Signed)
Patient ID: [name] [name], female   DOB: 08-25-1946, 73 y.o.   MRN: [id number]     Chief complaint: Follow-up of various problems History of Present Illness:    HYPERLIPIDEMIA:   She has had long-standing  hypercholesterolemia, baseline LDL about 176  She has had intolerance mostly with abnormal liver functions with several statin drugs  Had been on pravastatin since about 2011 which was discontinued because of liver function abnormality   She is currently taking Livalo 4 mg daily On her last visit she has missed a few doses of Livalo but now has been getting it regularly with improved LDL  She tries to watch her diet with low fat intake usually   Lab Results  Component Value Date   CHOL 176 09/21/2018   HDL 50.80 09/21/2018   LDLCALC 91 09/21/2018   LDLDIRECT 95.0 10/19/2017   TRIG 173.0 (H) 09/21/2018   CHOLHDL 3 09/21/2018     ABNORMAL LIVER functions:  She has had intermittent elevation of liver functions of unclear etiology. Previous CT scans have been normal and no ultrasound report available Liver biopsy was normal in 2009 Liver functions were abnormal on her visit in 10/16 but improved with stopping Pravachol even though she had been taking this without problems since 2011  She has been taking Livalo 4 mg consistently since about 2016 Her liver functions have been consistently normal    Lab Results  Component Value Date   ALT 14 09/21/2018   ALT 20 02/23/2018   ALT 19 10/19/2017   ALT 15 04/30/2017     HYPOTHYROIDISM:  She has had mild hypothyroidism since about 2007 and has required only low doses for supplementation for quite some time She feels fairly good recently, no complaints of feeling tired or any significant weight change  Currently taking 62.5 g Levothyroxine She is compliant with her medication before breakfast TSH is consistently normal  Previously did have some difficulty tolerating full doses of thyroxine supplementation   with increased anxiety and does fairly well with TSH in the upper normal range  She has a previous history of colloid nodule removed in 1986  Lab Results  Component Value Date   FREET4 0.95 10/19/2017   FREET4 0.94 02/13/2016   FREET4 1.02 03/06/2014   TSH 3.35 09/21/2018   TSH 3.54 02/23/2018   TSH 5.38 (H) 10/19/2017    IMPAIRED FASTING GLUCOSE:  Previously had a high glucose of 107 fasting  Is now excellent at 99 She is again trying to cut back on regular soft diet However has gained weight recently from inconsistent diet  A1c has been quite normal and consistent the prediabetic range, last 5.9 No family history of diabetes    Wt Readings from Last 3 Encounters:  09/23/18 145 lb 9.6 oz (66 kg)  02/25/18 140 lb 6.4 oz (63.7 kg)  10/22/17 139 lb (63 kg)   Lab Results  Component Value Date   HGBA1C 5.9 02/23/2018   HGBA1C 5.8 10/19/2017   HGBA1C 6.0 12/29/2016   Lab Results  Component Value Date   LDLCALC 91 09/21/2018   CREATININE 0.60 09/21/2018     Allergies as of 09/23/2018      Reactions   Omeprazole    REACTION: Reaction not known   Penicillins    REACTION: Reaction not known   Sulfonamide Derivatives    REACTION: Reaction not known      Medication List       Accurate as of September 23, 2018  1:12 PM. Always use your most recent med list.        cholecalciferol 1000 units tablet Commonly known as:  VITAMIN D Take 1,000 Units by mouth daily.   levothyroxine 125 MCG tablet Commonly known as:  SYNTHROID, LEVOTHROID TAKE 1/2 TABLET BY MOUTH ONCE DAILY ON AN EMPTY STOMACH   LORazepam 1 MG tablet Commonly known as:  ATIVAN take 1 tablet by mouth twice a day if needed for anxiety   pantoprazole 20 MG tablet Commonly known as:  PROTONIX TAKE 1 TABLET BY MOUTH ONCE DAILY   PARoxetine 20 MG tablet Commonly known as:  PAXIL TAKE 1 TABLET(20 MG) BY MOUTH DAILY   Pitavastatin Calcium 4 MG Tabs Commonly known as:  LIVALO Take 1 tablet (4 mg  total) by mouth daily.       Allergies:  Allergies  Allergen Reactions  . Omeprazole     REACTION: Reaction not known  . Penicillins     REACTION: Reaction not known  . Sulfonamide Derivatives     REACTION: Reaction not known    Past Medical History:  Diagnosis Date  . Adenomatous colon polyp    Tubular  . Anxiety   . Hypothyroidism   . Premature atrial beat    per pt - she can go into "premature atrial tachycardia" if she bends over too quickly- no medications needed    Past Surgical History:  Procedure Laterality Date  . BREAST BIOPSY  1980's   x2; negative  . CAROTID ENDARTERECTOMY  2012  . CARPAL TUNNEL RELEASE     bilat at different times  . EXCISIONAL HEMORRHOIDECTOMY  1974  . THYROIDECTOMY  1986  . TONSILLECTOMY  1965    Family History  Problem Relation Age of Onset  . Heart disease Father   . Stroke Father   . Thyroid disease Cousin   . Colon cancer Neg Hx   . Pancreatic cancer Neg Hx   . Stomach cancer Neg Hx   . Esophageal cancer Neg Hx   . Diabetes Neg Hx     Social History:  reports that she has quit smoking. She has never used smokeless tobacco. She reports that she does not drink alcohol or use drugs.  Review of Systems   PALPITATIONS:  Has a history of occasional episodes of SVT and has known short PR syndrome Recently she has not had any episodes She thinks previously Inderal caused drowsiness several years ago   OSTEOPENIA: She had a T score of -2.4 in 2013;  has not been treated with medications.  She again has not scheduled her bone density as recommended last year  VITAMIN D deficiency:  She had been on 1000 units dose of vitamin D Baseline vitamin D level was below 20 She has taken this irregularly and recently ran out     LABS:  Lab on 09/21/2018  Component Date Value Ref Range Status  . WBC 09/21/2018 4.9  4.0 - 10.5 K/uL Final  . RBC 09/21/2018 4.80  3.87 - 5.11 Mil/uL Final  . Platelets 09/21/2018 262.0  150.0 -  400.0 K/uL Final  . Hemoglobin 09/21/2018 13.6  12.0 - 15.0 g/dL Final  . HCT 09/21/2018 41.0  36.0 - 46.0 % Final  . MCV 09/21/2018 85.5  78.0 - 100.0 fl Final  . MCHC 09/21/2018 33.0  30.0 - 36.0 g/dL Final  . RDW 09/21/2018 13.5  11.5 - 15.5 % Final  . Cholesterol 09/21/2018 176  0 - 200 mg/dL Final  ATP III Classification       Desirable:  < 200 mg/dL               Borderline High:  200 - 239 mg/dL          High:  > = 240 mg/dL  . Triglycerides 09/21/2018 173.0* 0.0 - 149.0 mg/dL Final   Normal:  <150 mg/dLBorderline High:  150 - 199 mg/dL  . HDL 09/21/2018 50.80  >39.00 mg/dL Final  . VLDL 09/21/2018 34.6  0.0 - 40.0 mg/dL Final  . LDL Cholesterol 09/21/2018 91  0 - 99 mg/dL Final  . Total CHOL/HDL Ratio 09/21/2018 3   Final                  Men          Women1/2 Average Risk     3.4          3.3Average Risk          5.0          4.42X Average Risk          9.6          7.13X Average Risk          15.0          11.0                      . NonHDL 09/21/2018 125.62   Final   NOTE:  Non-HDL goal should be 30 mg/dL higher than patient's LDL goal (i.e. LDL goal of < 70 mg/dL, would have non-HDL goal of < 100 mg/dL)  . TSH 09/21/2018 3.35  0.35 - 4.50 uIU/mL Final  . Sodium 09/21/2018 137  135 - 145 mEq/L Final  . Potassium 09/21/2018 3.7  3.5 - 5.1 mEq/L Final  . Chloride 09/21/2018 102  96 - 112 mEq/L Final  . CO2 09/21/2018 30  19 - 32 mEq/L Final  . Glucose, Bld 09/21/2018 99  70 - 99 mg/dL Final  . BUN 09/21/2018 7  6 - 23 mg/dL Final  . Creatinine, Ser 09/21/2018 0.60  0.40 - 1.20 mg/dL Final  . Total Bilirubin 09/21/2018 0.6  0.2 - 1.2 mg/dL Final  . Alkaline Phosphatase 09/21/2018 91  39 - 117 U/L Final  . AST 09/21/2018 19  0 - 37 U/L Final  . ALT 09/21/2018 14  0 - 35 U/L Final  . Total Protein 09/21/2018 6.8  6.0 - 8.3 g/dL Final  . Albumin 09/21/2018 4.1  3.5 - 5.2 g/dL Final  . Calcium 09/21/2018 9.4  8.4 - 10.5 mg/dL Final  . GFR 09/21/2018 104.65  >60.00 mL/min Final     EXAM:  BP 130/86 (BP Location: Left Arm, Patient Position: Sitting, Cuff Size: Normal)   Pulse 90   Ht 5\' 1"  (1.549 m)   Wt 145 lb 9.6 oz (66 kg)   SpO2 97%   BMI 27.51 kg/m     Assessment/Plan:   Left carotid bruit: She is asymptomatic and need to get the report of her Doppler that was done in [place], this was reportedly only showing mild stenosis  HYPERLIPIDEMIA: LDL better controlled now with her taking Livalo regularly  Osteopenia/vitamin D deficiency: She will need a followup bone density and she can do this with her mammogram  Vitamin D deficiency: She still has not been regular with her supplement Advised her to take 4000 units daily since her level tends to be low and  she is not consistent with that   Impaired fasting glucose:   Fasting glucose is below 100 Encouraged her to work on weight loss and she will do this more when she is in [place]: This is mild and her TSH is consistently normal  Mild increase in blood pressure today: Likely to be from anxiety and she can keep a check at home  Patient Instructions  Vitamin D3 take 4000 units daily  Keep check on BP      [name] [name] 09/23/2018, 1:12 PM

## 2018-09-24 ENCOUNTER — Telehealth: Payer: Self-pay

## 2018-09-24 NOTE — Telephone Encounter (Signed)
Bone Density scan scheduled for march 4,2020 at 11:30am, which was the earliest available at [facility].  Pt must arrive no later than 11:15.  Attempted to call pt and inform her of this. Pt's husband answered and stated that he would have the pt return the call later.   If patient should need to reschedule her appt, the number to cancel/reschedule is [phone].

## 2018-10-12 ENCOUNTER — Other Ambulatory Visit: Payer: Self-pay | Admitting: Endocrinology

## 2018-10-18 ENCOUNTER — Other Ambulatory Visit: Payer: Self-pay | Admitting: Endocrinology

## 2018-10-18 NOTE — Telephone Encounter (Signed)
Please refill if appropriate

## 2018-11-17 ENCOUNTER — Other Ambulatory Visit: Payer: Medicare Other

## 2018-11-20 ENCOUNTER — Other Ambulatory Visit: Payer: Self-pay | Admitting: Endocrinology

## 2019-02-15 ENCOUNTER — Telehealth: Payer: Self-pay | Admitting: Endocrinology

## 2019-02-15 NOTE — Telephone Encounter (Signed)
Patient called to advise that she temporarily resides in [place] and needs an urgent referral to a cardiologist due to an arrhythmia.  The information for the referral is listed below:  Dr Festus Barren [facility] [place], [place]  Phone: [phone] phone option #7 for Tiffany (referrals coordinator) Fax: [phone]

## 2019-02-15 NOTE — Telephone Encounter (Signed)
Will need to know what she means by arrhythmia

## 2019-02-16 ENCOUNTER — Other Ambulatory Visit: Payer: Self-pay | Admitting: Endocrinology

## 2019-02-16 DIAGNOSIS — I471 Supraventricular tachycardia: Secondary | ICD-10-CM

## 2019-02-16 NOTE — Telephone Encounter (Signed)
I have sent a referral with previous diagnosis of PSVT.  Need to confirm this

## 2019-02-16 NOTE — Telephone Encounter (Signed)
Confirm that she is having problems with PSVT as before or tachycardia

## 2019-02-16 NOTE — Telephone Encounter (Signed)
Confirm that the office received the referral?

## 2019-02-17 NOTE — Telephone Encounter (Signed)
Called pt and she stated that the issues she is having is like last time, which is PSVT. Pt was informed that the referral was placed yesterday. Pt verbalized understanding.

## 2019-02-19 ENCOUNTER — Other Ambulatory Visit: Payer: Self-pay | Admitting: Endocrinology

## 2019-02-21 NOTE — Telephone Encounter (Signed)
Please refill if appropriate

## 2019-02-23 DIAGNOSIS — F419 Anxiety disorder, unspecified: Secondary | ICD-10-CM | POA: Diagnosis not present

## 2019-02-23 DIAGNOSIS — I779 Disorder of arteries and arterioles, unspecified: Secondary | ICD-10-CM | POA: Diagnosis not present

## 2019-02-23 DIAGNOSIS — R002 Palpitations: Secondary | ICD-10-CM | POA: Diagnosis not present

## 2019-02-23 DIAGNOSIS — I471 Supraventricular tachycardia: Secondary | ICD-10-CM | POA: Diagnosis not present

## 2019-02-23 DIAGNOSIS — R079 Chest pain, unspecified: Secondary | ICD-10-CM | POA: Diagnosis not present

## 2019-03-03 DIAGNOSIS — I6523 Occlusion and stenosis of bilateral carotid arteries: Secondary | ICD-10-CM | POA: Diagnosis not present

## 2019-03-08 ENCOUNTER — Other Ambulatory Visit: Payer: Medicare Other

## 2019-03-11 ENCOUNTER — Ambulatory Visit: Payer: Medicare Other | Admitting: Endocrinology

## 2019-03-14 DIAGNOSIS — R079 Chest pain, unspecified: Secondary | ICD-10-CM | POA: Diagnosis not present

## 2019-03-14 DIAGNOSIS — R002 Palpitations: Secondary | ICD-10-CM | POA: Diagnosis not present

## 2019-03-17 DIAGNOSIS — R002 Palpitations: Secondary | ICD-10-CM | POA: Diagnosis not present

## 2019-03-21 DIAGNOSIS — R002 Palpitations: Secondary | ICD-10-CM | POA: Diagnosis not present

## 2019-03-29 ENCOUNTER — Other Ambulatory Visit: Payer: Medicare Other

## 2019-03-31 ENCOUNTER — Ambulatory Visit: Payer: Medicare Other | Admitting: Endocrinology

## 2019-04-06 ENCOUNTER — Other Ambulatory Visit: Payer: Self-pay | Admitting: Endocrinology

## 2019-04-06 DIAGNOSIS — R079 Chest pain, unspecified: Secondary | ICD-10-CM | POA: Diagnosis not present

## 2019-04-11 DIAGNOSIS — I10 Essential (primary) hypertension: Secondary | ICD-10-CM | POA: Diagnosis not present

## 2019-04-11 DIAGNOSIS — R002 Palpitations: Secondary | ICD-10-CM | POA: Diagnosis not present

## 2019-04-11 DIAGNOSIS — F419 Anxiety disorder, unspecified: Secondary | ICD-10-CM | POA: Diagnosis not present

## 2019-04-11 DIAGNOSIS — I471 Supraventricular tachycardia: Secondary | ICD-10-CM | POA: Diagnosis not present

## 2019-05-02 ENCOUNTER — Other Ambulatory Visit: Payer: Self-pay | Admitting: Endocrinology

## 2019-05-17 ENCOUNTER — Other Ambulatory Visit (INDEPENDENT_AMBULATORY_CARE_PROVIDER_SITE_OTHER): Payer: Medicare Other

## 2019-05-17 ENCOUNTER — Other Ambulatory Visit: Payer: Self-pay

## 2019-05-17 DIAGNOSIS — E7800 Pure hypercholesterolemia, unspecified: Secondary | ICD-10-CM | POA: Diagnosis not present

## 2019-05-17 DIAGNOSIS — E559 Vitamin D deficiency, unspecified: Secondary | ICD-10-CM

## 2019-05-17 DIAGNOSIS — R7303 Prediabetes: Secondary | ICD-10-CM

## 2019-05-17 DIAGNOSIS — E063 Autoimmune thyroiditis: Secondary | ICD-10-CM | POA: Diagnosis not present

## 2019-05-17 LAB — T4, FREE: Free T4: 1.37 ng/dL (ref 0.60–1.60)

## 2019-05-17 LAB — HEMOGLOBIN A1C: Hgb A1c MFr Bld: 5.8 % (ref 4.6–6.5)

## 2019-05-17 LAB — BASIC METABOLIC PANEL
BUN: 5 mg/dL — ABNORMAL LOW (ref 6–23)
CO2: 26 mEq/L (ref 19–32)
Calcium: 9.4 mg/dL (ref 8.4–10.5)
Chloride: 100 mEq/L (ref 96–112)
Creatinine, Ser: 0.55 mg/dL (ref 0.40–1.20)
GFR: 108.66 mL/min (ref >60.00)
Glucose, Bld: 107 mg/dL — ABNORMAL HIGH (ref 70–99)
Potassium: 4 mEq/L (ref 3.5–5.1)
Sodium: 135 mEq/L (ref 135–145)

## 2019-05-17 LAB — TSH: TSH: 1.09 u[IU]/mL (ref 0.35–4.50)

## 2019-05-17 LAB — LIPID PANEL
Cholesterol: 166 mg/dL (ref 0–200)
HDL: 61.4 mg/dL (ref >39.00)
LDL Cholesterol: 82 mg/dL (ref 0–99)
NonHDL: 104.89
Total CHOL/HDL Ratio: 3
Triglycerides: 116 mg/dL (ref 0.0–149.0)
VLDL: 23.2 mg/dL (ref 0.0–40.0)

## 2019-05-17 LAB — VITAMIN D 25 HYDROXY (VIT D DEFICIENCY, FRACTURES): VITD: 17.22 ng/mL — ABNORMAL LOW (ref 30.00–100.00)

## 2019-05-20 ENCOUNTER — Other Ambulatory Visit: Payer: Self-pay

## 2019-05-20 ENCOUNTER — Encounter: Payer: Self-pay | Admitting: Endocrinology

## 2019-05-20 ENCOUNTER — Telehealth: Payer: Self-pay | Admitting: Endocrinology

## 2019-05-20 ENCOUNTER — Ambulatory Visit (INDEPENDENT_AMBULATORY_CARE_PROVIDER_SITE_OTHER): Payer: Medicare Other | Admitting: Endocrinology

## 2019-05-20 VITALS — BP 134/88 | HR 70 | Ht 61.0 in | Wt 131.8 lb

## 2019-05-20 DIAGNOSIS — E559 Vitamin D deficiency, unspecified: Secondary | ICD-10-CM

## 2019-05-20 DIAGNOSIS — I471 Supraventricular tachycardia: Secondary | ICD-10-CM

## 2019-05-20 DIAGNOSIS — R7301 Impaired fasting glucose: Secondary | ICD-10-CM

## 2019-05-20 DIAGNOSIS — E063 Autoimmune thyroiditis: Secondary | ICD-10-CM | POA: Diagnosis not present

## 2019-05-20 DIAGNOSIS — E7800 Pure hypercholesterolemia, unspecified: Secondary | ICD-10-CM

## 2019-05-20 MED ORDER — LORAZEPAM 1 MG PO TABS: 1.0000 mg | ORAL_TABLET | Freq: Three times a day (TID) | ORAL | 3 refills | Status: DC | PRN

## 2019-05-20 MED ORDER — ERGOCALCIFEROL 1.25 MG (50000 UT) PO CAPS: 50000.0000 [IU] | ORAL_CAPSULE | ORAL | 2 refills | Status: DC

## 2019-05-20 NOTE — Telephone Encounter (Signed)
Patient states Dr. Dwyane Dee is writing new RX's for Lorazepam and Vitamin D. Patient requests the new RX's and any future refills to be sent (today for 2 new RX's) to the following PHARM: [facility] [place], [place] [phone] (Phone) [phone] (Fax)

## 2019-05-20 NOTE — Progress Notes (Signed)
Patient ID: [name] [name], female   DOB: 17-Nov-1945, 73 y.o.   MRN: [id number]    Chief complaint: Palpitations, anxiety and follow-up of various problems   History of Present Illness:   PALPITATION:   In early summer 2020 she started having palpitations, she has had these before but has not been on treatment for quite some time and has had minimal symptoms. In the past she had been tried on Inderal which apparently she could not tolerate However she started having more symptoms of mostly feeling of her heart flipping and some racing She was referred to a cardiologist in [place] where she lives and not clear what the reports were from the Holter monitor However because of apparently her blood pressure going up to as high as 170/94 she was asked to try metoprolol 25 mg weeks which she has relatively low blood pressure or nausea and can stop this  Subsequently her blood pressure continues to stay up in the 0000000 and diastolic in the [id number] and low 90s This week on her own she started taking 1/4 tablet of the 25 mg metoprolol which she was tolerating and she thinks her blood pressure was better the last couple of days down to 142/80-84  Has only mild nausea with this dose of metoprolol. No records are available from evaluation in [place]:  She said because of her pandemic as well as problems with palpitations she has had much more anxiety and occasionally feeling shaky She does not think she is more depressed More recently she has taken 1/2 tablet Ativan twice a day along with 2 tablets at night which she usually takes long-term for sleep She feels a little better but she thinks that she needs a higher dose Still taking Paxil but she takes this in the morning  HYPERLIPIDEMIA:   She has had long-standing  hypercholesterolemia, baseline LDL about 176  She has had intolerance mostly with abnormal liver functions with several statin drugs.     Had been on pravastatin  since about 2011 which was discontinued because of liver function abnormality   She is continuing to be taking Livalo 4 mg daily LDL is excellent She is concerned about her medication costing her more if she is not able to get [employer] later this year  She also usually watching high-fat foods   Lab Results  Component Value Date   CHOL 166 05/17/2019   HDL 61.40 05/17/2019   LDLCALC 82 05/17/2019   LDLDIRECT 95.0 10/19/2017   TRIG 116.0 05/17/2019   CHOLHDL 3 05/17/2019     ABNORMAL LIVER functions:  She has had intermittent elevation of liver functions of unclear etiology. Previous CT scans have been normal and no ultrasound report available Liver biopsy was normal in 2009 Liver functions were abnormal on her visit in 10/16 but improved with stopping Pravachol even though she had been taking this without problems since 2011  She has been taking Livalo 4 mg consistently since about 2016 Her liver functions have been normal as below   Lab Results  Component Value Date   ALT 14 09/21/2018   ALT 20 02/23/2018   ALT 19 10/19/2017   ALT 15 04/30/2017     HYPOTHYROIDISM:  She has had mild hypothyroidism since about 2007 and has required only low doses for supplementation for quite some time Previously did have some difficulty tolerating full doses of thyroxine supplementation  with increased anxiety and does fairly well with TSH in the upper  normal range  Still taking 62.5 g Levothyroxine She takes her levothyroxine daily before breakfast  Although she has had some weight loss this is unrelated and she does not complain of any unusual fatigue  TSH is consistently normal   She has a previous history of colloid nodule removed in 1986  Lab Results  Component Value Date   FREET4 1.37 05/17/2019   FREET4 0.95 10/19/2017   FREET4 0.94 02/13/2016   TSH 1.09 05/17/2019   TSH 3.35 09/21/2018   TSH 3.54 02/23/2018    IMPAIRED FASTING GLUCOSE:  Previously  has had a high glucose of 107 fasting, subsequent was 99, 8 months ago However again this is back up to 107  On her last visit she had gained some weight but this is back down now However she has had more stress Usually trying to avoid regular soft drinks as recommended No home monitoring   A1c has been quite normal and consistent the prediabetic range, now 5.8 compared to 5.9 No family history of diabetes    Wt Readings from Last 3 Encounters:  05/20/19 131 lb 12.8 oz (59.8 kg)  09/23/18 145 lb 9.6 oz (66 kg)  02/25/18 140 lb 6.4 oz (63.7 kg)   Lab Results  Component Value Date   HGBA1C 5.8 05/17/2019   HGBA1C 5.9 02/23/2018   HGBA1C 5.8 10/19/2017   Lab Results  Component Value Date   LDLCALC 82 05/17/2019   CREATININE 0.55 05/17/2019     Allergies as of 05/20/2019      Reactions   Omeprazole    REACTION: Reaction not known   Penicillins    REACTION: Reaction not known   Sulfonamide Derivatives    REACTION: Reaction not known      Medication List       Accurate as of May 20, 2019  2:03 PM. If you have any questions, ask your nurse or doctor.        STOP taking these medications   cholecalciferol 1000 units tablet Commonly known as: VITAMIN D Stopped by: Elayne Snare, MD     TAKE these medications   levothyroxine 125 MCG tablet Commonly known as: SYNTHROID TAKE 1/2 TABLET BY MOUTH EVERY DAY ON AN EMPTY STOMACH   LORazepam 1 MG tablet Commonly known as: ATIVAN TAKE 1 TABLET BY MOUTH TWICE DAILY AS NEEDED FOR ANXIETY   pantoprazole 20 MG tablet Commonly known as: PROTONIX TAKE 1 TABLET BY MOUTH EVERY DAY   PARoxetine 20 MG tablet Commonly known as: PAXIL TAKE 1 TABLET(20 MG) BY MOUTH DAILY   Pitavastatin Calcium 4 MG Tabs Commonly known as: Livalo Take 1 tablet (4 mg total) by mouth daily.       Allergies:  Allergies  Allergen Reactions  . Omeprazole     REACTION: Reaction not known  . Penicillins     REACTION: Reaction not known   . Sulfonamide Derivatives     REACTION: Reaction not known    Past Medical History:  Diagnosis Date  . Adenomatous colon polyp    Tubular  . Anxiety   . Hypothyroidism   . Premature atrial beat    per pt - she can go into "premature atrial tachycardia" if she bends over too quickly- no medications needed    Past Surgical History:  Procedure Laterality Date  . BREAST BIOPSY  1980's   x2; negative  . CAROTID ENDARTERECTOMY  2012  . CARPAL TUNNEL RELEASE     bilat at different times  . EXCISIONAL  HEMORRHOIDECTOMY  1974  . THYROIDECTOMY  1986  . TONSILLECTOMY  1965    Family History  Problem Relation Age of Onset  . Heart disease Father   . Stroke Father   . Thyroid disease Cousin   . Colon cancer Neg Hx   . Pancreatic cancer Neg Hx   . Stomach cancer Neg Hx   . Esophageal cancer Neg Hx   . Diabetes Neg Hx     Social History:  reports that she has quit smoking. She has never used smokeless tobacco. She reports that she does not drink alcohol or use drugs.  Review of Systems   PALPITATIONS:  Has a history of occasional episodes of SVT and has known short PR syndrome Recently she has not had any episodes She thinks previously Inderal caused drowsiness several years ago  Left carotid bruit: She is asymptomatic and need to get the report of her Doppler that was done in [place], this was reportedly only showing mild stenosis OSTEOPENIA: She had a T score of -2.4 in 2013;  has not been treated with medications.  She again has not scheduled her bone density as recommended last year  VITAMIN D deficiency:  She had been on 1000 units dose of vitamin D Baseline vitamin D level was below 20 She has taken this irregularly and recently ran out  anxius paxil  3 daily  LABS:  Lab on 05/17/2019  Component Date Value Ref Range Status  . VITD 05/17/2019 17.22* 30.00 - 100.00 ng/mL Final  . Free T4 05/17/2019 1.37  0.60 - 1.60 ng/dL Final   Comment: Specimens from  patients who are undergoing biotin therapy and /or ingesting biotin supplements may contain high levels of biotin.  The higher biotin concentration in these specimens interferes with this Free T4 assay.  Specimens that contain high levels  of biotin may cause false high results for this Free T4 assay.  Please interpret results in light of the total clinical presentation of the patient.    Marland Kitchen TSH 05/17/2019 1.09  0.35 - 4.50 uIU/mL Final  . Cholesterol 05/17/2019 166  0 - 200 mg/dL Final   ATP III Classification       Desirable:  < 200 mg/dL               Borderline High:  200 - 239 mg/dL          High:  > = 240 mg/dL  . Triglycerides 05/17/2019 116.0  0.0 - 149.0 mg/dL Final   Normal:  <150 mg/dLBorderline High:  150 - 199 mg/dL  . HDL 05/17/2019 61.40  >39.00 mg/dL Final  . VLDL 05/17/2019 23.2  0.0 - 40.0 mg/dL Final  . LDL Cholesterol 05/17/2019 82  0 - 99 mg/dL Final  . Total CHOL/HDL Ratio 05/17/2019 3   Final                  Men          Women1/2 Average Risk     3.4          3.3Average Risk          5.0          4.42X Average Risk          9.6          7.13X Average Risk          15.0          11.0                      .  NonHDL 05/17/2019 104.89   Final   NOTE:  Non-HDL goal should be 30 mg/dL higher than patient's LDL goal (i.e. LDL goal of < 70 mg/dL, would have non-HDL goal of < 100 mg/dL)  . Sodium 05/17/2019 135  135 - 145 mEq/L Final  . Potassium 05/17/2019 4.0  3.5 - 5.1 mEq/L Final  . Chloride 05/17/2019 100  96 - 112 mEq/L Final  . CO2 05/17/2019 26  19 - 32 mEq/L Final  . Glucose, Bld 05/17/2019 107* 70 - 99 mg/dL Final  . BUN 05/17/2019 5* 6 - 23 mg/dL Final  . Creatinine, Ser 05/17/2019 0.55  0.40 - 1.20 mg/dL Final  . Calcium 05/17/2019 9.4  8.4 - 10.5 mg/dL Final  . GFR 05/17/2019 108.66  >60.00 mL/min Final  . Hgb A1c MFr Bld 05/17/2019 5.8  4.6 - 6.5 % Final   Glycemic Control Guidelines for People with Diabetes:Non Diabetic:  <6%Goal of Therapy: <7%Additional Action  Suggested:  >8%     EXAM:  BP 134/88 (BP Location: Left Arm, Patient Position: Sitting, Cuff Size: Normal)   Pulse 70   Ht 5\' 1"  (1.549 m)   Wt 131 lb 12.8 oz (59.8 kg)   SpO2 98%   BMI 24.90 kg/m     Assessment/Plan:    PALPITATIONS/history of PAT and short PR syndrome: Detailed history above She will try to get records from [place] and also reviewed with EPS specialist here, referral has been made Since her symptoms are somewhat better this week with low-dose metoprolol will continue, alternatively may benefit from diltiazem  HYPERLIPIDEMIA: LDL better controlled now with her taking Livalo regularly  Osteopenia/vitamin D deficiency: She will need a followup bone density and she can do this with her mammogram  Vitamin D deficiency: She still has not been regular with her supplement Her level is still low She will probably do better with taking a prescription vitamin D which will be sent 50,000 units weekly   Impaired fasting glucose:   Fasting glucose is above 100 This is despite weight loss and relatively better diet Since A1c is quite normal will continue to watch She does need to do regular exercise which she has not been doing   HYPOTHYROID: This is consistently controlled with low-dose levothyroxine Again her TSH is consistently normal  HYPERTENSION: She appears to have hypertension now and blood pressure has been mostly high However some of this is related to significant anxiety She is somewhat better with adding low-dose metoprolol this week and since she only started this 2 or 3 days ago we will continue to watch She will also discuss optimal treatment with cardiologist on upcoming consultation  ANXIETY: She has not had enough control of symptoms with taking 3 mg Ativan a day Likely can do better chronically with increasing her Paxil also She will try to take this at night to avoid drowsiness Discussed that she needs more than 3 mg of Ativan daily will need  to refer her to a psychiatrist for more optimal management Alternatively try adding BuSpar on the next visit  There are no Patient Instructions on file for this visit.  Total visit time for evaluation and management of multiple problems and counseling = 35 minutes  Vaniah Chambers 05/20/2019, 2:03 PM

## 2019-05-20 NOTE — Telephone Encounter (Signed)
Changed.

## 2019-05-20 NOTE — Telephone Encounter (Signed)
Please change the default pharmacy on her record

## 2019-05-20 NOTE — Patient Instructions (Signed)
Paxil 1 1/2 at [facility]

## 2019-05-25 NOTE — Telephone Encounter (Signed)
Pt changed mind

## 2019-06-05 ENCOUNTER — Other Ambulatory Visit: Payer: Self-pay | Admitting: Endocrinology

## 2019-06-07 ENCOUNTER — Encounter: Payer: Self-pay | Admitting: Cardiology

## 2019-06-07 ENCOUNTER — Ambulatory Visit (INDEPENDENT_AMBULATORY_CARE_PROVIDER_SITE_OTHER): Payer: Medicare Other | Admitting: Cardiology

## 2019-06-07 ENCOUNTER — Other Ambulatory Visit: Payer: Self-pay

## 2019-06-07 VITALS — BP 144/86 | HR 67 | Ht 61.0 in | Wt 130.4 lb

## 2019-06-07 DIAGNOSIS — R002 Palpitations: Secondary | ICD-10-CM

## 2019-06-07 MED ORDER — DILTIAZEM HCL ER COATED BEADS 120 MG PO CP24: 120.0000 mg | ORAL_CAPSULE | Freq: Every day | ORAL | 11 refills | Status: DC

## 2019-06-07 NOTE — Progress Notes (Signed)
Electrophysiology Office Note   Date:  06/07/2019   ID:  [name], [name] 1945/10/06, MRN [id number]  PCP:  Elayne Snare, MD  Cardiologist:   Primary Electrophysiologist:  Cap Massi Meredith Leeds, MD    Chief Complaint: palpitations   History of Present Illness: [name] [name] is a 74 y.o. female who is being seen today for the evaluation of palpitations at the request of Elayne Snare, MD. Presenting today for electrophysiology evaluation.  Early this summer, she started having palpitations.  She has had these before but has not had therapy for some time.  She had been tried on propranolol though she did not tolerate the medication.  Her symptoms feel that her heart is flipping and racing.  Today, she denies symptoms of palpitations, chest pain, shortness of breath, orthopnea, PND, lower extremity edema, claudication, dizziness, presyncope, syncope, bleeding, or neurologic sequela. The patient is tolerating medications without difficulties.  He has palpitations on almost a daily basis.  There are times where she can bear down which makes her palpitations stop.  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   Past Medical History:  Diagnosis Date  . Adenomatous colon polyp    Tubular  . Anxiety   . Hypothyroidism   . Premature atrial beat    per pt - she can go into "premature atrial tachycardia" if she bends over too quickly- no medications needed   Past Surgical History:  Procedure Laterality Date  . BREAST BIOPSY  1980's   x2; negative  . CAROTID ENDARTERECTOMY  2012  . CARPAL TUNNEL RELEASE     bilat at different times  . EXCISIONAL HEMORRHOIDECTOMY  1974  . THYROIDECTOMY  1986  . TONSILLECTOMY  1965     Current Outpatient Medications  Medication Sig Dispense Refill  . aspirin EC 81 MG tablet Take 81 mg by mouth daily.    . ergocalciferol (VITAMIN D2) 1.25 MG (50000 UT) capsule Take 1 capsule (50,000 Units total) by mouth once a week. 4 capsule 2   . levothyroxine (SYNTHROID) 125 MCG tablet TAKE 1/2 TABLET BY MOUTH EVERY DAY ON AN EMPTY STOMACH 15 tablet 5  . LORazepam (ATIVAN) 1 MG tablet Take 1 tablet (1 mg total) by mouth every 8 (eight) hours as needed. for anxiety 90 tablet 3  . pantoprazole (PROTONIX) 20 MG tablet TAKE 1 TABLET BY MOUTH EVERY DAY 30 tablet 5  . PARoxetine (PAXIL) 30 MG tablet TAKE 1 TABLET(30 MG) BY MOUTH AT BEDTIME 30 tablet 3  . Pitavastatin Calcium (LIVALO) 4 MG TABS Take 1 tablet (4 mg total) by mouth daily. 90 tablet 4  . diltiazem (CARDIZEM CD) 120 MG 24 hr capsule Take 1 capsule (120 mg total) by mouth daily. 30 capsule 11   No current facility-administered medications for this visit.     Allergies:   Omeprazole, Penicillins, and Sulfonamide derivatives   Social History:  The patient  reports that she has quit smoking. She has never used smokeless tobacco. She reports that she does not drink alcohol or use drugs.   Family History:  The patient's family history includes Heart disease in her father; Stroke in her father; Thyroid disease in her cousin.    ROS:  Please see the history of present illness.   Otherwise, review of systems is positive for none.   All other systems are reviewed and negative.    PHYSICAL EXAM: VS:  BP (!) 144/86   Pulse 67  Ht 5\' 1"  (1.549 m)   Wt 130 lb 6.4 oz (59.1 kg)   SpO2 100%   BMI 24.64 kg/m  , BMI Body mass index is 24.64 kg/m. GEN: Well nourished, well developed, in no acute distress  HEENT: normal  Neck: no JVD, carotid bruits, or masses Cardiac: RRR; no murmurs, rubs, or gallops,no edema  Respiratory:  clear to auscultation bilaterally, normal work of breathing GI: soft, nontender, nondistended, + BS MS: no deformity or atrophy  Skin: warm and dry Neuro:  Strength and sensation are intact Psych: euthymic mood, full affect  EKG:  EKG is ordered today. Personal review of the ekg ordered shows sinus rhythm  Recent Labs: 09/21/2018: ALT 14; Hemoglobin  13.6; Platelets 262.0 05/17/2019: BUN 5; Creatinine, Ser 0.55; Potassium 4.0; Sodium 135; TSH 1.09    Lipid Panel     Component Value Date/Time   CHOL 166 05/17/2019 1106   TRIG 116.0 05/17/2019 1106   HDL 61.40 05/17/2019 1106   CHOLHDL 3 05/17/2019 1106   VLDL 23.2 05/17/2019 1106   LDLCALC 82 05/17/2019 1106   LDLDIRECT 95.0 10/19/2017 0929     Wt Readings from Last 3 Encounters:  06/07/19 130 lb 6.4 oz (59.1 kg)  05/20/19 131 lb 12.8 oz (59.8 kg)  09/23/18 145 lb 9.6 oz (66 kg)      Other studies Reviewed: Additional studies/ records that were reviewed today include: TTE 03/14/2019 Mild concentric LVH Normal LV systolic function Normal LV size Mild MR Mild TR  ASSESSMENT AND PLAN:  1.  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  It is unclear to me if she has any other causes for her SVT, though she does state that she has an atrial tachycardia.  She is on minimal doses of metoprolol which I feel are homeopathic at this point.  We [name] stop her metoprolol and start her on diltiazem 120 mg daily.  This [name] likely need to be increased for affect.    Current medicines are reviewed at length with the patient today.   The patient does not have concerns regarding her medicines.  The following changes were made today: Stop metoprolol, start diltiazem  Labs/ tests ordered today include:  Orders Placed This Encounter  Procedures  . EKG 12-Lead     Disposition:   FU with [name] 3 months  Signed, [name], MD  06/07/2019 2:08 PM     [address] [place] [address] [phone] (office) [phone] (fax)

## 2019-06-07 NOTE — Patient Instructions (Signed)
Medication Instructions:  Please discontinue your Metoprolol and start Diltazem 120 mg daily. Continue all other medications as listed.  If you need a refill on your cardiac medications before your next appointment, please call your pharmacy.   Follow-Up: At [facility], you and your health needs are our priority.  As part of our continuing mission to provide you with exceptional heart care, we have created designated Provider Care Teams.  These Care Teams include your primary Cardiologist (physician) and Advanced Practice Providers (APPs -  Physician Assistants and Nurse Practitioners) who all work together to provide you with the care you need, when you need it. You will need a follow up appointment in 3 months.  Please call our office 2 months in advance to schedule this appointment.  You may see Dr Curt Bears or one of the following Advanced Practice Providers on your designated Care Team:   Chanetta Marshall, NP . Tommye Standard, PA-C  Thank you for choosing [facility]!!

## 2019-06-15 DIAGNOSIS — Z23 Encounter for immunization: Secondary | ICD-10-CM | POA: Diagnosis not present

## 2019-08-08 ENCOUNTER — Other Ambulatory Visit: Payer: Self-pay | Admitting: Endocrinology

## 2019-08-13 ENCOUNTER — Other Ambulatory Visit: Payer: Self-pay | Admitting: Endocrinology

## 2019-08-15 ENCOUNTER — Other Ambulatory Visit: Payer: Medicare Other

## 2019-08-17 ENCOUNTER — Ambulatory Visit: Payer: Medicare Other | Admitting: Endocrinology

## 2019-09-14 ENCOUNTER — Other Ambulatory Visit: Payer: Self-pay | Admitting: Endocrinology

## 2019-09-14 NOTE — Telephone Encounter (Signed)
Please refill if appropriate

## 2019-09-21 ENCOUNTER — Telehealth: Payer: Self-pay

## 2019-09-21 NOTE — Telephone Encounter (Signed)
MEDICATION: LORazepam (ATIVAN) 1 MG tablet  PHARMACY:  [id number] - [place], [place] - [address]  IS THIS A 90 DAY SUPPLY :   IS PATIENT OUT OF MEDICATION: no   IF NOT; HOW MUCH IS LEFT: 20  LAST APPOINTMENT DATE: @12 /30/2020  NEXT APPOINTMENT DATE:@Visit  date not found  DO WE HAVE YOUR PERMISSION TO LEAVE A DETAILED MESSAGE:  OTHER COMMENTS:    **Let patient know to contact pharmacy at the end of the day to make sure medication is ready. **  ** Please notify patient to allow 48-72 hours to process**  **Encourage patient to contact the pharmacy for refills or they can request refills through [facility]**

## 2019-09-21 NOTE — Telephone Encounter (Signed)
This Rx was sent on 09/14/2019

## 2019-09-21 NOTE — Telephone Encounter (Signed)
Please refill if appropriate

## 2019-09-21 NOTE — Telephone Encounter (Signed)
?   Is this too early

## 2019-09-23 ENCOUNTER — Other Ambulatory Visit: Payer: Self-pay | Admitting: Endocrinology

## 2019-10-04 ENCOUNTER — Other Ambulatory Visit: Payer: Self-pay | Admitting: Endocrinology

## 2019-10-21 ENCOUNTER — Other Ambulatory Visit: Payer: Self-pay | Admitting: Endocrinology

## 2019-10-21 NOTE — Telephone Encounter (Signed)
Rx sent. Called pt to schedule appt. She stated that she would speak with her husband and call back to schedule appt.

## 2019-10-21 NOTE — Telephone Encounter (Signed)
Ok to refill 

## 2019-10-21 NOTE — Telephone Encounter (Signed)
She can have a 90-day supply.  Please call her to make a follow-up appointment with labs

## 2019-11-01 ENCOUNTER — Other Ambulatory Visit: Payer: Self-pay | Admitting: Endocrinology

## 2019-11-11 ENCOUNTER — Telehealth: Payer: Self-pay | Admitting: Endocrinology

## 2019-11-11 NOTE — Telephone Encounter (Signed)
MEDICATION: Lorazepam  PHARMACY:  [facility] in [place]  IS THIS A 90 DAY SUPPLY : no, 30 day  IS PATIENT OUT OF MEDICATION:  no  IF NOT; HOW MUCH IS LEFT: 2-3 left  LAST APPOINTMENT DATE: @2 /16/2021  NEXT APPOINTMENT DATE:@04 /05/2020  DO WE HAVE YOUR PERMISSION TO LEAVE A DETAILED MESSAGE: Per patient the 90 pills prescribed on 09/14/2019 that were picked up on 09/22/2019 were a 30 day supply since she was to take 1 every 8 hours as needed.  She will be returning to [facility] end of March 2020 and has scheduled lab and Dr Dwyane Dee follow up appointment. Patient is requesting RX of Lorazepam that would fill with 60 pills instead of 90 pills for 30 day.  OTHER COMMENTS:    **Let patient know to contact pharmacy at the end of the day to make sure medication is ready. **  ** Please notify patient to allow 48-72 hours to process**  **Encourage patient to contact the pharmacy for refills or they can request refills through [facility]**

## 2019-11-11 NOTE — Telephone Encounter (Signed)
Please refill if appropriate

## 2019-11-14 ENCOUNTER — Other Ambulatory Visit: Payer: Self-pay | Admitting: Endocrinology

## 2019-11-14 MED ORDER — LORAZEPAM 1 MG PO TABS: 1.0000 mg | ORAL_TABLET | Freq: Two times a day (BID) | ORAL | 0 refills | Status: DC

## 2019-11-14 NOTE — Telephone Encounter (Signed)
done

## 2019-11-29 ENCOUNTER — Other Ambulatory Visit: Payer: Self-pay | Admitting: Endocrinology

## 2019-12-19 ENCOUNTER — Telehealth: Payer: Self-pay

## 2019-12-19 ENCOUNTER — Other Ambulatory Visit: Payer: Self-pay | Admitting: Endocrinology

## 2019-12-19 MED ORDER — LORAZEPAM 1 MG PO TABS: 1.0000 mg | ORAL_TABLET | Freq: Two times a day (BID) | ORAL | 1 refills | Status: DC

## 2019-12-19 NOTE — Telephone Encounter (Signed)
Received fax from pt's pharmacy requesting refill of Lorazepam. Please refill if appropriate.

## 2019-12-20 ENCOUNTER — Other Ambulatory Visit: Payer: Medicare Other

## 2019-12-23 ENCOUNTER — Ambulatory Visit: Payer: Medicare Other | Admitting: Endocrinology

## 2019-12-26 ENCOUNTER — Other Ambulatory Visit: Payer: Self-pay | Admitting: Endocrinology

## 2019-12-29 ENCOUNTER — Other Ambulatory Visit: Payer: Medicare Other

## 2020-01-02 ENCOUNTER — Ambulatory Visit: Payer: Medicare Other | Admitting: Endocrinology

## 2020-01-11 ENCOUNTER — Other Ambulatory Visit: Payer: Self-pay | Admitting: Endocrinology

## 2020-01-11 NOTE — Telephone Encounter (Signed)
Refill or deny? 

## 2020-01-11 NOTE — Telephone Encounter (Signed)
Okay to refill? 

## 2020-01-13 ENCOUNTER — Telehealth: Payer: Self-pay | Admitting: *Deleted

## 2020-01-13 NOTE — Telephone Encounter (Signed)
-----   Message from Alben Spittle sent at 10/31/2019  2:31 PM EST ----- Hey!  I called this patient about her 3 month f/u with Dr. Curt Bears (was supposed to be 3 months from September, Palpitations/SVT)  I spoke w/her husband again today. He said that she has not had any issues or problems and they were planning on coming back in March or April but were unsure of their itinerary. He said once they were back in town from [place], she would call to get an appt on their schedule.   Just FYI!  Thank you,  [facility]

## 2020-01-14 ENCOUNTER — Other Ambulatory Visit: Payer: Self-pay | Admitting: Endocrinology

## 2020-01-18 ENCOUNTER — Other Ambulatory Visit: Payer: Self-pay | Admitting: Endocrinology

## 2020-01-21 ENCOUNTER — Other Ambulatory Visit: Payer: Self-pay | Admitting: Endocrinology

## 2020-02-04 ENCOUNTER — Other Ambulatory Visit: Payer: Self-pay | Admitting: Endocrinology

## 2020-02-14 ENCOUNTER — Other Ambulatory Visit (INDEPENDENT_AMBULATORY_CARE_PROVIDER_SITE_OTHER): Payer: Medicare Other

## 2020-02-14 ENCOUNTER — Other Ambulatory Visit: Payer: Self-pay

## 2020-02-14 DIAGNOSIS — R7301 Impaired fasting glucose: Secondary | ICD-10-CM

## 2020-02-14 DIAGNOSIS — E7800 Pure hypercholesterolemia, unspecified: Secondary | ICD-10-CM | POA: Diagnosis not present

## 2020-02-14 DIAGNOSIS — E063 Autoimmune thyroiditis: Secondary | ICD-10-CM

## 2020-02-14 DIAGNOSIS — E559 Vitamin D deficiency, unspecified: Secondary | ICD-10-CM

## 2020-02-14 LAB — HEMOGLOBIN A1C: Hgb A1c MFr Bld: 5.9 % (ref 4.6–6.5)

## 2020-02-14 LAB — COMPREHENSIVE METABOLIC PANEL
ALT: 27 U/L (ref 0–35)
AST: 27 U/L (ref 0–37)
Albumin: 4.2 g/dL (ref 3.5–5.2)
Alkaline Phosphatase: 93 U/L (ref 39–117)
BUN: 7 mg/dL (ref 6–23)
CO2: 30 mEq/L (ref 19–32)
Calcium: 9.2 mg/dL (ref 8.4–10.5)
Chloride: 99 mEq/L (ref 96–112)
Creatinine, Ser: 0.54 mg/dL (ref 0.40–1.20)
GFR: 110.75 mL/min (ref >60.00)
Glucose, Bld: 107 mg/dL — ABNORMAL HIGH (ref 70–99)
Potassium: 4.2 mEq/L (ref 3.5–5.1)
Sodium: 136 mEq/L (ref 135–145)
Total Bilirubin: 0.5 mg/dL (ref 0.2–1.2)
Total Protein: 7.1 g/dL (ref 6.0–8.3)

## 2020-02-14 LAB — TSH: TSH: 2.42 u[IU]/mL (ref 0.35–4.50)

## 2020-02-14 LAB — VITAMIN D 25 HYDROXY (VIT D DEFICIENCY, FRACTURES): VITD: 20.88 ng/mL — ABNORMAL LOW (ref 30.00–100.00)

## 2020-02-16 NOTE — Progress Notes (Signed)
Patient ID: [name] [name], female   DOB: 10-07-1945, 74 y.o.   MRN: [id number]    Chief complaint: follow-up of various problems   History of Present Illness:   HYPERTENSION/PALPITATIONS:  On her last visit on her own she started taking 1/4 tablet of the 25 mg metoprolol since higher doses caused side previously blood pressure had come down to 142/80-84 at home She subsequently stopped this on her own and did not follow-up Also has not followed up with her cardiologist who had recommended Cardizem and she did not take this She has not checked at home lately She thinks blood pressure is higher today in the office because of stress   Prior history:  In early summer 2020 she started having palpitations, she has had these before but has not been on treatment for quite some time and has had minimal symptoms. In the past she had been tried on Inderal which apparently she could not tolerate However she started having more symptoms of mostly feeling of her heart flipping and some racing She was referred to a cardiologist in [place]  and not clear what the reports were from the Holter monitor Since blood pressure was up to 170/94 she was asked to try metoprolol 25 mg with which she cannot take because of low normal blood pressure or nausea    ANXIETY:  She was told to increase her Paxil on the last visit because of increased anxiety and she did that for a short time However she is feeling better now and maintaining on the same dose of Paxil and Ativan Again recently however she is having some stress but is comfortable continuing the same regimen  HYPERLIPIDEMIA:   She has had long-standing  hypercholesterolemia, baseline LDL about 176  She has had intolerance mostly with abnormal liver functions with several statin drugs.   Had been on pravastatin since about 2011 which was discontinued because of liver function abnormality   She is continuing to be taking Livalo 4 mg  daily LDL is controlled as of 9/20 She is concerned about her medication costing her more on the Medicare advantage plans   Lab Results  Component Value Date   CHOL 166 05/17/2019   HDL 61.40 05/17/2019   LDLCALC 82 05/17/2019   LDLDIRECT 95.0 10/19/2017   TRIG 116.0 05/17/2019   CHOLHDL 3 05/17/2019     ABNORMAL LIVER functions:  She has had intermittent elevation of liver functions of unclear etiology. Previous CT scans have been normal and no ultrasound report available Liver biopsy was normal in 2009 Liver functions were abnormal on her visit in 10/16 but improved with stopping Pravachol even though she had been taking this without problems since 2011  She has been taking Livalo 4 mg consistently since about 2016 Her liver function is again normal as before    Lab Results  Component Value Date   ALT 27 02/14/2020   ALT 14 09/21/2018   ALT 20 02/23/2018   ALT 19 10/19/2017     HYPOTHYROIDISM:  She has had mild hypothyroidism since about 2007 and has required only low doses for supplementation for quite some time Previously did have some difficulty tolerating full doses of thyroxine supplementation  with increased anxiety and does fairly well with TSH in the upper normal range  Still taking 62.5 g Levothyroxine She takes her levothyroxine daily before breakfast She feels fairly good recently  TSH is consistently normal   She has a previous history of colloid nodule  removed in 1986  Lab Results  Component Value Date   FREET4 1.37 05/17/2019   FREET4 0.95 10/19/2017   FREET4 0.94 02/13/2016   TSH 2.42 02/14/2020   TSH 1.09 05/17/2019   TSH 3.35 09/21/2018    IMPAIRED FASTING GLUCOSE:  Has a high glucose of 107 fasting again, previously was down to 99  He appears to have gained some weight to the auto of 10 pounds She is likely not watching her diet but she thinks she is avoiding sweets Today however had a donut for breakfast Currently not walking  much which she does mostly when she is in [place]  A1c has been consistently normal and in the prediabetic range, now 5.9 No family history of diabetes    Wt Readings from Last 3 Encounters:  02/17/20 140 lb 3.2 oz (63.6 kg)  06/07/19 130 lb 6.4 oz (59.1 kg)  05/20/19 131 lb 12.8 oz (59.8 kg)   Lab Results  Component Value Date   HGBA1C 5.9 02/14/2020   HGBA1C 5.8 05/17/2019   HGBA1C 5.9 02/23/2018   Lab Results  Component Value Date   LDLCALC 82 05/17/2019   CREATININE 0.54 02/14/2020   Other problems: See review of systems  Allergies as of 02/17/2020      Reactions   Omeprazole    REACTION: Reaction not known   Penicillins    REACTION: Reaction not known   Sulfonamide Derivatives    REACTION: Reaction not known      Medication List       Accurate as of February 17, 2020 11:34 AM. If you have any questions, ask your nurse or doctor.        aspirin EC 81 MG tablet Take 81 mg by mouth daily.   diltiazem 120 MG 24 hr capsule Commonly known as: CARDIZEM CD Take 1 capsule (120 mg total) by mouth daily.   levothyroxine 125 MCG tablet Commonly known as: SYNTHROID TAKE 1/2 TABLET BY MOUTH EVERY DAY ON AN EMPTY STOMACH   Livalo 4 MG Tabs Generic drug: Pitavastatin Calcium TAKE 1 TABLET DAILY   LORazepam 1 MG tablet Commonly known as: ATIVAN Take 1 tablet (1 mg total) by mouth 2 (two) times daily. As needed   pantoprazole 20 MG tablet Commonly known as: PROTONIX TAKE 1 TABLET BY MOUTH EVERY DAY   PARoxetine 30 MG tablet Commonly known as: PAXIL TAKE 1 TABLET(30 MG) BY MOUTH AT BEDTIME   PARoxetine 20 MG tablet Commonly known as: PAXIL TAKE 1 TABLET(20 MG) BY MOUTH DAILY   Vitamin D (Ergocalciferol) 1.25 MG (50000 UNIT) Caps capsule Commonly known as: DRISDOL TAKE 1 CAPSULE BY MOUTH 1 TIME A WEEK       Allergies:  Allergies  Allergen Reactions  . Omeprazole     REACTION: Reaction not known  . Penicillins     REACTION: Reaction not known  .  Sulfonamide Derivatives     REACTION: Reaction not known    Past Medical History:  Diagnosis Date  . Adenomatous colon polyp    Tubular  . Anxiety   . Hypothyroidism   . Premature atrial beat    per pt - she can go into "premature atrial tachycardia" if she bends over too quickly- no medications needed    Past Surgical History:  Procedure Laterality Date  . BREAST BIOPSY  1980's   x2; negative  . CAROTID ENDARTERECTOMY  2012  . CARPAL TUNNEL RELEASE     bilat at different times  . EXCISIONAL HEMORRHOIDECTOMY  [place]  . TONSILLECTOMY  1965    Family History  Problem Relation Age of Onset  . Heart disease Father   . Stroke Father   . Thyroid disease Cousin   . Colon cancer Neg Hx   . Pancreatic cancer Neg Hx   . Stomach cancer Neg Hx   . Esophageal cancer Neg Hx   . Diabetes Neg Hx     Social History:  reports that she has quit smoking. She has never used smokeless tobacco. She reports that she does not drink alcohol or use drugs.  Review of Systems   PALPITATIONS:  Has a history of occasional episodes of SVT and has known short PR syndrome Recently she has not had any episodes She thinks previously Inderal caused drowsiness and metoprolol because of nausea  Left carotid bruit: She is asymptomatic and need to get the report of her Doppler that was done in [place], this was reportedly only showing mild stenosis  OSTEOPENIA: She had a T score of -2.4 in 2013;  has not been treated with medications.  She again has not scheduled her bone density as several times  VITAMIN D deficiency:  She thinks vitamin D makes her nauseated and will not take it Also has tried prescription vitamin D with the same results and has consistently low levels  LABS:  Lab on 02/14/2020  Component Date Value Ref Range Status  . VITD 02/14/2020 20.88* 30.00 - 100.00 ng/mL Final  . TSH 02/14/2020 2.42  0.35 - 4.50 uIU/mL Final  . Sodium 02/14/2020 136  135 - 145  mEq/L Final  . Potassium 02/14/2020 4.2  3.5 - 5.1 mEq/L Final  . Chloride 02/14/2020 99  96 - 112 mEq/L Final  . CO2 02/14/2020 30  19 - 32 mEq/L Final  . Glucose, Bld 02/14/2020 107* 70 - 99 mg/dL Final  . BUN 02/14/2020 7  6 - 23 mg/dL Final  . Creatinine, Ser 02/14/2020 0.54  0.40 - 1.20 mg/dL Final  . Total Bilirubin 02/14/2020 0.5  0.2 - 1.2 mg/dL Final  . Alkaline Phosphatase 02/14/2020 93  39 - 117 U/L Final  . AST 02/14/2020 27  0 - 37 U/L Final  . ALT 02/14/2020 27  0 - 35 U/L Final  . Total Protein 02/14/2020 7.1  6.0 - 8.3 g/dL Final  . Albumin 02/14/2020 4.2  3.5 - 5.2 g/dL Final  . GFR 02/14/2020 110.75  >60.00 mL/min Final  . Calcium 02/14/2020 9.2  8.4 - 10.5 mg/dL Final  . Hgb A1c MFr Bld 02/14/2020 5.9  4.6 - 6.5 % Final   Glycemic Control Guidelines for People with Diabetes:Non Diabetic:  <6%Goal of Therapy: <7%Additional Action Suggested:  >8%     EXAM:  BP (!) 156/84 (BP Location: Left Arm, Patient Position: Sitting, Cuff Size: Normal)   Pulse 100   Ht 5\' 1"  (1.549 m)   Wt 140 lb 3.2 oz (63.6 kg)   SpO2 98%   BMI 26.49 kg/m     Assessment/Plan:    PALPITATIONS/history of PAT and short PR syndrome: No recurrence recently and she can follow-up with cardiologist as needed  HYPERLIPIDEMIA: LDL has been controlled previously with her taking Livalo regularly  Osteopenia/vitamin D deficiency: She will need a followup bone density and she can do this with her mammogram She will be scheduled when she comes back from [place]  Vitamin D deficiency: Levels are persistently low She thinks she cannot tolerate any form of vitamin D  and recommended that she take a multivitamin containing 1000 units such as women's One-A-Day   Impaired fasting glucose:   Fasting glucose is the same at 107 She has gone off her diet and gained weight Since A1c is still in the prediabetic range and not higher will continue to watch She does need to do regular exercise which she  thinks she can start doing now with going back to [place]: No complaints of fatigue now This is again controlled with low-dose levothyroxine, using half of the 125 mcg tablet  TSH is consistently normal  HYPERTENSION: She appears to have persistent hypertension However she is reluctant to start any medications because of previous side effects with metoprolol and Cardizem Advised her to start checking blood pressure regularly at home which she can and call if persistently high  ANXIETY: She does not complain of increasing symptoms Recently has had more stress but still doing well with Paxil and Ativan and will continue  Recommended trying to establish with a new PCP when she goes to [place] in 1 week to continue her prescriptions  There are no Patient Instructions on file for this visit.     Elayne Snare 02/17/2020, 11:34 AM   Addendum: Lipids well controlled

## 2020-02-17 ENCOUNTER — Ambulatory Visit (INDEPENDENT_AMBULATORY_CARE_PROVIDER_SITE_OTHER): Payer: Medicare Other | Admitting: Endocrinology

## 2020-02-17 ENCOUNTER — Encounter: Payer: Self-pay | Admitting: Endocrinology

## 2020-02-17 ENCOUNTER — Other Ambulatory Visit: Payer: Self-pay

## 2020-02-17 VITALS — BP 156/84 | HR 100 | Ht 61.0 in | Wt 140.2 lb

## 2020-02-17 DIAGNOSIS — E063 Autoimmune thyroiditis: Secondary | ICD-10-CM

## 2020-02-17 DIAGNOSIS — R7301 Impaired fasting glucose: Secondary | ICD-10-CM

## 2020-02-17 DIAGNOSIS — E7800 Pure hypercholesterolemia, unspecified: Secondary | ICD-10-CM

## 2020-02-17 DIAGNOSIS — E559 Vitamin D deficiency, unspecified: Secondary | ICD-10-CM | POA: Diagnosis not present

## 2020-02-17 LAB — LIPID PANEL
Cholesterol: 180 mg/dL (ref 0–200)
HDL: 67.8 mg/dL (ref >39.00)
LDL Cholesterol: 84 mg/dL (ref 0–99)
NonHDL: 112.59
Total CHOL/HDL Ratio: 3
Triglycerides: 144 mg/dL (ref 0.0–149.0)
VLDL: 28.8 mg/dL (ref 0.0–40.0)

## 2020-02-17 MED ORDER — LORAZEPAM 1 MG PO TABS: 1.0000 mg | ORAL_TABLET | Freq: Two times a day (BID) | ORAL | 0 refills | Status: DC

## 2020-02-17 NOTE — Patient Instructions (Addendum)
Check BP  Weekly, call if reading over 140/90  Womens 1 a day for vitamin D  Regular exercise

## 2020-02-19 NOTE — Progress Notes (Signed)
Please call to let patient know that the cholesterol results are unchanged/normal and no further action needed

## 2020-03-14 ENCOUNTER — Other Ambulatory Visit: Payer: Self-pay | Admitting: Endocrinology

## 2020-03-15 ENCOUNTER — Other Ambulatory Visit: Payer: Self-pay | Admitting: Endocrinology

## 2020-03-15 NOTE — Telephone Encounter (Signed)
Please refill if appropriate

## 2020-05-23 ENCOUNTER — Other Ambulatory Visit: Payer: Self-pay | Admitting: Endocrinology

## 2020-05-23 NOTE — Telephone Encounter (Signed)
Please refill if appropriate

## 2020-05-23 NOTE — Telephone Encounter (Signed)
Please ask her if she has a new PCP who needs to take over the prescription

## 2020-05-24 NOTE — Telephone Encounter (Signed)
Called pt and inquire as to whether she has gotten a new PCP or not. Pt states that she has not, yet. She does not have any ativan left at this time. Pt was reminded that she should search for a new PCP, and she verbalized understanding of this.

## 2020-06-03 ENCOUNTER — Other Ambulatory Visit: Payer: Self-pay | Admitting: Endocrinology

## 2020-06-15 ENCOUNTER — Other Ambulatory Visit: Payer: Medicare Other

## 2020-06-18 ENCOUNTER — Ambulatory Visit: Payer: Medicare Other | Admitting: Endocrinology

## 2020-07-10 DIAGNOSIS — K219 Gastro-esophageal reflux disease without esophagitis: Secondary | ICD-10-CM | POA: Diagnosis not present

## 2020-07-10 DIAGNOSIS — F411 Generalized anxiety disorder: Secondary | ICD-10-CM | POA: Diagnosis not present

## 2020-09-13 DIAGNOSIS — I6523 Occlusion and stenosis of bilateral carotid arteries: Secondary | ICD-10-CM | POA: Diagnosis not present
